# Patient Record
Sex: Male | Born: 1985 | Race: White | Hispanic: No | Marital: Married | Smoking: Former smoker
Health system: Southern US, Community
[De-identification: ages and names within clinical notes are randomized; demographics above are authoritative.]

## PROBLEM LIST (undated history)

## (undated) DIAGNOSIS — F419 Anxiety disorder, unspecified: Secondary | ICD-10-CM

## (undated) HISTORY — DX: Anxiety disorder, unspecified: F41.9

---
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
## 2006-01-26 ENCOUNTER — Ambulatory Visit: Payer: Self-pay | Admitting: Family Medicine

## 2006-02-05 ENCOUNTER — Ambulatory Visit: Payer: Self-pay | Admitting: Family Medicine

## 2006-12-16 ENCOUNTER — Emergency Department (HOSPITAL_COMMUNITY): Admission: EM | Admit: 2006-12-16 | Discharge: 2006-12-16 | Payer: Self-pay | Admitting: Emergency Medicine

## 2008-03-02 ENCOUNTER — Emergency Department (HOSPITAL_COMMUNITY): Admission: EM | Admit: 2008-03-02 | Discharge: 2008-03-02 | Payer: Self-pay | Admitting: Emergency Medicine

## 2009-02-06 ENCOUNTER — Emergency Department (HOSPITAL_COMMUNITY): Admission: EM | Admit: 2009-02-06 | Discharge: 2009-02-06 | Payer: Self-pay | Admitting: Emergency Medicine

## 2009-02-12 ENCOUNTER — Emergency Department (HOSPITAL_COMMUNITY): Admission: EM | Admit: 2009-02-12 | Discharge: 2009-02-12 | Payer: Self-pay | Admitting: Family Medicine

## 2009-05-09 ENCOUNTER — Emergency Department (HOSPITAL_COMMUNITY): Admission: EM | Admit: 2009-05-09 | Discharge: 2009-05-09 | Payer: Self-pay | Admitting: Family Medicine

## 2009-10-23 ENCOUNTER — Emergency Department (HOSPITAL_COMMUNITY): Admission: EM | Admit: 2009-10-23 | Discharge: 2009-10-23 | Payer: Self-pay | Admitting: Family Medicine

## 2010-05-15 ENCOUNTER — Emergency Department (HOSPITAL_COMMUNITY): Admission: EM | Admit: 2010-05-15 | Discharge: 2010-05-15 | Payer: Self-pay | Admitting: Family Medicine

## 2013-05-06 ENCOUNTER — Ambulatory Visit: Payer: Self-pay | Admitting: Family Medicine

## 2013-05-06 VITALS — BP 108/65 | HR 86 | Temp 98.9°F | Resp 18 | Wt 153.0 lb

## 2013-05-06 DIAGNOSIS — IMO0002 Reserved for concepts with insufficient information to code with codable children: Secondary | ICD-10-CM

## 2013-05-06 DIAGNOSIS — T07XXXA Unspecified multiple injuries, initial encounter: Secondary | ICD-10-CM
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
## 2013-05-06 NOTE — Patient Instructions (Addendum)
You have abrasions on your back and elbow, and a mild concussion.  When you have a concussion it is important to rest- this means mental and physical rest.  Sleep/ rest as much as you can, and avoid physical exercise.  Avoid reading, texting, using a computer or watching TV, listening to music is ok.  If you develop vomiting, severe headache, confusion or any other concerning symptoms please call, come back in to clinic or go to the ER.    Keep your abrasions clean, and pat dry after bathing.  We are glad to re-bandage your larger abrasion in a couple of days if you would like.    You need to rest until you are recovered fully from your concussion.  Going back to work too early will prolong your symptoms and could even lead to more permanent damage.  Please come back or call me if you are not feeling back to normal in 2 days or so

## 2013-05-09 ENCOUNTER — Ambulatory Visit: Payer: Self-pay

## 2015-05-14 ENCOUNTER — Encounter (HOSPITAL_COMMUNITY): Payer: Self-pay | Admitting: *Deleted

## 2015-05-14 ENCOUNTER — Emergency Department (HOSPITAL_COMMUNITY)
Admission: EM | Admit: 2015-05-14 | Discharge: 2015-05-14 | Disposition: A | Discharge status: Home / Self Care | Payer: Self-pay | Attending: Emergency Medicine | Admitting: Emergency Medicine

## 2015-05-14 DIAGNOSIS — X58XXXA Exposure to other specified factors, initial encounter: Secondary | ICD-10-CM | POA: Insufficient documentation

## 2015-05-14 DIAGNOSIS — Y9289 Other specified places as the place of occurrence of the external cause: Secondary | ICD-10-CM | POA: Insufficient documentation

## 2015-05-14 DIAGNOSIS — Z87891 Personal history of nicotine dependence: Secondary | ICD-10-CM | POA: Insufficient documentation

## 2015-05-14 DIAGNOSIS — Y9389 Activity, other specified: Secondary | ICD-10-CM | POA: Insufficient documentation

## 2015-05-14 DIAGNOSIS — Y998 Other external cause status: Secondary | ICD-10-CM | POA: Insufficient documentation

## 2015-05-14 DIAGNOSIS — T782XXA Anaphylactic shock, unspecified, initial encounter: Secondary | ICD-10-CM | POA: Insufficient documentation

## 2015-05-14 MED ORDER — METHYLPREDNISOLONE SODIUM SUCC 125 MG IJ SOLR
125.0000 mg | Freq: Once | INTRAMUSCULAR | Status: AC
  Administered 2015-05-14: 125 mg via INTRAVENOUS
  Filled 2015-05-14: qty 2

## 2015-05-14 MED ORDER — SODIUM CHLORIDE 0.9 % IV BOLUS (SEPSIS)
1000.0000 mL | Freq: Once | INTRAVENOUS | Status: AC
  Administered 2015-05-14: 1000 mL via INTRAVENOUS

## 2015-05-14 MED ORDER — PREDNISONE 20 MG PO TABS: 60.0000 mg | ORAL_TABLET | Freq: Every day | ORAL | Status: DC

## 2015-05-14 MED ORDER — FAMOTIDINE IN NACL 20-0.9 MG/50ML-% IV SOLN
20.0000 mg | Freq: Once | INTRAVENOUS | Status: AC
  Administered 2015-05-14: 20 mg via INTRAVENOUS
  Filled 2015-05-14: qty 50

## 2015-05-14 MED ORDER — DIPHENHYDRAMINE HCL 50 MG/ML IJ SOLN
25.0000 mg | Freq: Once | INTRAMUSCULAR | Status: AC
  Administered 2015-05-14: 25 mg via INTRAVENOUS
  Filled 2015-05-14: qty 1

## 2015-05-14 MED ORDER — EPINEPHRINE 0.3 MG/0.3ML IJ SOAJ: 0.3000 mg | Freq: Once | INTRAMUSCULAR | Status: DC

## 2015-05-14 MED ORDER — EPINEPHRINE 0.3 MG/0.3ML IJ SOAJ
INTRAMUSCULAR | Status: AC
  Filled 2015-05-14: qty 0.3

## 2015-05-14 MED ORDER — EPINEPHRINE 0.3 MG/0.3ML IJ SOAJ
0.3000 mg | Freq: Once | INTRAMUSCULAR | Status: AC
  Administered 2015-05-14: 0.3 mg via INTRAMUSCULAR

## 2015-05-14 NOTE — ED Notes (Signed)
Pt states he was stung by a bee around 12 pm.  States never had allergic rxn to a bee sting before.  Ate lunch and realized he was itching all over.  Face is red, with oral edema.

## 2015-05-14 NOTE — ED Notes (Signed)
No visible hives. No swelling noted to lips or mouth. NAD noted.

## 2015-05-14 NOTE — Discharge Instructions (Signed)

## 2015-05-14 NOTE — ED Provider Notes (Signed)
History   Chief Complaint  Patient presents with  . Allergic Reaction    HPI Patient is a previously healthy 29 year old male who presents to ED after allergic reaction. Patient reports at noon he was stung by a bee. Patient denies any prior history of allergic reactions side from mild rash with detergents. Denies allergies to bees in the past. Patient reports he had [employer] for lunch prior to this. Patient reports having diffuse rash to his upper chest and back as well as swelling of his lips and throat tightening. Patient is now complaining of shortness of breath. Denies previous illness and states he was in his usual state of health prior to the allergic reaction. Denies abdominal pain, nausea, vomiting.  Onset of symptoms: Acute. Duration 1 hour.  Modifying factors none.  Severity: Severe.  Associated symptoms: As above.  Hx of similar symptoms: No.    Past medical/surgical history, social history, medications, allergies and FH have been reviewed with patient and/or in documentation. Furthermore, if pt family or friend(s) present, additional historical information was obtained from them.  History reviewed. No pertinent past medical history. History reviewed. No pertinent past surgical history. Family History  Problem Relation Age of Onset  . Cancer Mother    History  Substance Use Topics  . Smoking status: Former Games developer  . Smokeless tobacco: Not on file  . Alcohol Use: Yes     Comment: occ     Review of Systems Constitutional: - F/C, -fatigue.  HENT: - congestion, -rhinorrhea, -sore throat.   Eyes: - eye pain, -visual disturbance.  Respiratory: - cough, +SOB, -hemoptysis.   Cardiovascular: - CP, -palps.  Gastrointestinal: - N/V/D, -abd pain  Genitourinary: - flank pain, -dysuria, -frequency.  Musculoskeletal: - myalgia/arthritis, -joint swelling, -gait abnormality, -back pain, -neck pain/stiffness, -leg pain/swelling.  Skin: + rash/lesion.  Neurological: - focal weakness,  -lightheadedness, -dizziness, -numbness, -HA.  All other systems reviewed and are negative.   Physical Exam  Physical Exam  ED Triage Vitals  Enc Vitals Group     BP 05/14/15 1256 142/116 mmHg     Pulse Rate 05/14/15 1256 73     Resp 05/14/15 1256 16     Temp 05/14/15 1256 98.1 F (36.7 C)     Temp Source 05/14/15 1256 Oral     SpO2 05/14/15 1256 96 %     Weight 05/14/15 1256 160 lb (72.576 kg)     Height 05/14/15 1256  (1.702 m)     Head Cir --      Peak Flow --      Pain Score 05/14/15 1257 2     Pain Loc --      Pain Edu? --      Excl. in GC? --    Constitutional: Patient is well appearing and in mild acute distress Head: Normocephalic and atraumatic.  Eyes: Extraocular motion intact, no scleral icterus Mouth: MMM, very small amount of OP edemat and erythemata Neck: Supple without meningismus, mass, or overt JVD Respiratory: No respiratory distress. Normal WOB. No w/r/g. CV: RRR, no obvious murmurs.  Pulses +2 and symmetric. Euvolemic Abdomen: Soft, NT, ND, no r/g. No mass.  MSK: Extremities are atraumatic without deformity, ROM intact Skin: Diffuse erythematous rash consistent with urticaria to the upper chest and back. Patient has swelling to his perioral region. Neuro: AAOx4, MAE 5/5 sym, no focal deficit noted   ED Course  Procedures    EKG Interpretation  Date/Time:    Ventricular Rate:  PR Interval:    QRS Duration:   QT Interval:    QTC Calculation:   R Axis:     Text Interpretation:         MDM: Gunnar Fusi Szydlowski is a 29 y.o. male with H&P as above who p/w CC: allergic rxn  -Initial impression: This is a piercing healthy 29 year old without history of allergic reactions in the past who arrives with perioral edema and erythema/edema to oropharynx. No wheezing. Hemodynamic stable. Epinephrine given. Benadryl, Zantac, Solumedrol given. Fluids. EKG.  1:33 PM Pt reports significant improvement in sxs. Rash nearly resolved. OP & lip swelling  improved.  2:13 PM Sxs resolved. HDS, NAD. Patient was observed in ED following epinephrine and patient's symptoms remained resolved and patient has been deemed stable for discharge. Patient was given a prescription for an EpiPen and sterilized. Old records reviewed (if available). Labs and imaging reviewed personally by myself and considered in medical decision making if ordered. Clinical Impression: 1. Anaphylaxis, initial encounter     Disposition: Discharge  Condition: Good  I have discussed the results, Dx and Tx plan with the pt(& family if present). He/she/they expressed understanding and agree(s) with the plan. Discharge instructions discussed at great length. Strict return precautions discussed and pt &/or family have verbalized understanding of the instructions. No further questions at time of discharge.    New Prescriptions   EPINEPHRINE 0.3 MG/0.3 ML IJ SOAJ INJECTION    Inject 0.3 mLs (0.3 mg total) into the muscle once.   PREDNISONE (DELTASONE) 20 MG TABLET    Take 3 tablets (60 mg total) by mouth daily.    Follow Up: [facility]     [address] [place] [address] [phone]     Pt seen in conjunction with Dr. Mirian Mo, MD  Ames Dura, DO [facility] Emergency Medicine Resident - PGY-3      Ames Dura, MD 05/14/15 1512  Mirian Mo, MD 05/15/15 [id number]

## 2016-05-03 ENCOUNTER — Ambulatory Visit: Payer: Self-pay | Admitting: Family Medicine

## 2017-05-23 ENCOUNTER — Ambulatory Visit (INDEPENDENT_AMBULATORY_CARE_PROVIDER_SITE_OTHER): Payer: No Typology Code available for payment source | Admitting: Adult Health

## 2017-05-23 ENCOUNTER — Encounter: Payer: Self-pay | Admitting: Adult Health

## 2017-05-23 VITALS — BP 108/65 | HR 81 | Ht 66.25 in | Wt 167.1 lb

## 2017-05-23 DIAGNOSIS — M25512 Pain in left shoulder: Secondary | ICD-10-CM

## 2017-05-23 DIAGNOSIS — M79642 Pain in left hand: Secondary | ICD-10-CM

## 2017-05-23 DIAGNOSIS — M25532 Pain in left wrist: Secondary | ICD-10-CM

## 2017-05-23 DIAGNOSIS — Z0000 Encounter for general adult medical examination without abnormal findings: Secondary | ICD-10-CM | POA: Diagnosis not present

## 2017-05-23 DIAGNOSIS — M25531 Pain in right wrist: Secondary | ICD-10-CM

## 2017-05-23 DIAGNOSIS — M25511 Pain in right shoulder: Secondary | ICD-10-CM

## 2017-05-23 DIAGNOSIS — M79641 Pain in right hand: Secondary | ICD-10-CM | POA: Insufficient documentation

## 2017-05-23 MED ORDER — MELOXICAM 7.5 MG PO TABS: 7.5000 mg | ORAL_TABLET | Freq: Two times a day (BID) | ORAL | 1 refills | Status: DC

## 2017-05-23 NOTE — Patient Instructions (Signed)
Joint Pain Joint pain, which is also called arthralgia, can be caused by many things. Joint pain often goes away when you follow your health care provider's instructions for relieving pain at home. However, joint pain can also be caused by conditions that require further treatment. Common causes of joint pain include:  Bruising in the area of the joint.  Overuse of the joint.  Wear and tear on the joints that occur with aging (osteoarthritis).  Various other forms of arthritis.  A buildup of a crystal form of uric acid in the joint (gout).  Infections of the joint (septic arthritis) or of the bone (osteomyelitis).  Your health care provider may recommend medicine to help with the pain. If your joint pain continues, additional tests may be needed to diagnose your condition. Follow these instructions at home: Watch your condition for any changes. Follow these instructions as directed to lessen the pain that you are feeling.  Take medicines only as directed by your health care provider.  Rest the affected area for as long as your health care provider says that you should. If directed to do so, raise the painful joint above the level of your heart while you are sitting or lying down.  Do not do things that cause or worsen pain.  If directed, apply ice to the painful area: ? Put ice in a plastic bag. ? Place a towel between your skin and the bag. ? Leave the ice on for 20 minutes, 2-3 times per day.  Wear an elastic bandage, splint, or sling as directed by your health care provider. Loosen the elastic bandage or splint if your fingers or toes become numb and tingle, or if they turn cold and blue.  Begin exercising or stretching the affected area as directed by your health care provider. Ask your health care provider what types of exercise are safe for you.  Keep all follow-up visits as directed by your health care provider. This is important.  Contact a health care provider if:  Your  pain increases, and medicine does not help.  Your joint pain does not improve within 3 days.  You have increased bruising or swelling.  You have a fever.  You lose 10 lb (4.5 kg) or more without trying. Get help right away if:  You are not able to move the joint.  Your fingers or toes become numb or they turn cold and blue. This information is not intended to replace advice given to you by your health care provider. Make sure you discuss any questions you have with your health care provider. Document Released: 09/25/2005 Document Revised: 02/25/2016 Document Reviewed: 07/07/2014 Elsevier Interactive Patient Education  2018 [employer].   Shoulder Exercises Ask your health care provider which exercises are safe for you. Do exercises exactly as told by your health care provider and adjust them as directed. It is normal to feel mild stretching, pulling, tightness, or discomfort as you do these exercises, but you should stop right away if you feel sudden pain or your pain gets worse.Do not begin these exercises until told by your health care provider. RANGE OF MOTION EXERCISES These exercises warm up your muscles and joints and improve the movement and flexibility of your shoulder. These exercises also help to relieve pain, numbness, and tingling. These exercises involve stretching your injured shoulder directly. Exercise A: Pendulum  1. Stand near a wall or a surface that you can hold onto for balance. 2. Bend at the waist and let your  left / right arm hang straight down. Use your other arm to support you. Keep your back straight and do not lock your knees. 3. Relax your left / right arm and shoulder muscles, and move your hips and your trunk so your left / right arm swings freely. Your arm should swing because of the motion of your body, not because you are using your arm or shoulder muscles. 4. Keep moving your body so your arm swings in the following directions, as told by your health  care provider: ? Side to side. ? Forward and backward. ? In clockwise and counterclockwise circles. 5. Continue each motion for __________ seconds, or for as long as told by your health care provider. 6. Slowly return to the starting position. Repeat __________ times. Complete this exercise __________ times a day. Exercise B:Flexion, Standing  1. Stand and hold a broomstick, a cane, or a similar object. Place your hands a little more than shoulder-width apart on the object. Your left / right hand should be palm-up, and your other hand should be palm-down. 2. Keep your elbow straight and keep your shoulder muscles relaxed. Push the stick down with your healthy arm to raise your left / right arm in front of your body, and then over your head until you feel a stretch in your shoulder. ? Avoid shrugging your shoulder while you raise your arm. Keep your shoulder blade tucked down toward the middle of your back. 3. Hold for __________ seconds. 4. Slowly return to the starting position. Repeat __________ times. Complete this exercise __________ times a day. Exercise C: Abduction, Standing 1. Stand and hold a broomstick, a cane, or a similar object. Place your hands a little more than shoulder-width apart on the object. Your left / right hand should be palm-up, and your other hand should be palm-down. 2. While keeping your elbow straight and your shoulder muscles relaxed, push the stick across your body toward your left / right side. Raise your left / right arm to the side of your body and then over your head until you feel a stretch in your shoulder. ? Do not raise your arm above shoulder height, unless your health care provider tells you to do that. ? Avoid shrugging your shoulder while you raise your arm. Keep your shoulder blade tucked down toward the middle of your back. 3. Hold for __________ seconds. 4. Slowly return to the starting position. Repeat __________ times. Complete this exercise  __________ times a day. Exercise D:Internal Rotation  1. Place your left / right hand behind your back, palm-up. 2. Use your other hand to dangle an exercise band, a towel, or a similar object over your shoulder. Grasp the band with your left / right hand so you are holding onto both ends. 3. Gently pull up on the band until you feel a stretch in the front of your left / right shoulder. ? Avoid shrugging your shoulder while you raise your arm. Keep your shoulder blade tucked down toward the middle of your back. 4. Hold for __________ seconds. 5. Release the stretch by letting go of the band and lowering your hands. Repeat __________ times. Complete this exercise __________ times a day. STRETCHING EXERCISES These exercises warm up your muscles and joints and improve the movement and flexibility of your shoulder. These exercises also help to relieve pain, numbness, and tingling. These exercises are done using your healthy shoulder to help stretch the muscles of your injured shoulder. Exercise E: Runner, broadcasting/film/videoCorner Stretch (External Rotation  and Abduction)  1. Stand in a doorway with one of your feet slightly in front of the other. This is called a staggered stance. If you cannot reach your forearms to the door frame, stand facing a corner of a room. 2. Choose one of the following positions as told by your health care provider: ? Place your hands and forearms on the door frame above your head. ? Place your hands and forearms on the door frame at the height of your head. ? Place your hands on the door frame at the height of your elbows. 3. Slowly move your weight onto your front foot until you feel a stretch across your chest and in the front of your shoulders. Keep your head and chest upright and keep your abdominal muscles tight. 4. Hold for __________ seconds. 5. To release the stretch, shift your weight to your back foot. Repeat __________ times. Complete this stretch __________ times a day. Exercise  F:Extension, Standing 1. Stand and hold a broomstick, a cane, or a similar object behind your back. ? Your hands should be a little wider than shoulder-width apart. ? Your palms should face away from your back. 2. Keeping your elbows straight and keeping your shoulder muscles relaxed, move the stick away from your body until you feel a stretch in your shoulder. ? Avoid shrugging your shoulders while you move the stick. Keep your shoulder blade tucked down toward the middle of your back. 3. Hold for __________ seconds. 4. Slowly return to the starting position. Repeat __________ times. Complete this exercise __________ times a day. STRENGTHENING EXERCISES These exercises build strength and endurance in your shoulder. Endurance is the ability to use your muscles for a long time, even after they get tired. Exercise G:External Rotation  1. Sit in a stable chair without armrests. 2. Secure an exercise band at elbow height on your left / right side. 3. Place a soft object, such as a folded towel or a small pillow, between your left / right upper arm and your body to move your elbow a few inches away (about 10 cm) from your side. 4. Hold the end of the band so it is tight and there is no slack. 5. Keeping your elbow pressed against the soft object, move your left / right forearm out, away from your abdomen. Keep your body steady so only your forearm moves. 6. Hold for __________ seconds. 7. Slowly return to the starting position. Repeat __________ times. Complete this exercise __________ times a day. Exercise H:Shoulder Abduction  1. Sit in a stable chair without armrests, or stand. 2. Hold a __________ weight in your left / right hand, or hold an exercise band with both hands. 3. Start with your arms straight down and your left / right palm facing in, toward your body. 4. Slowly lift your left / right hand out to your side. Do not lift your hand above shoulder height unless your health care  provider tells you that this is safe. ? Keep your arms straight. ? Avoid shrugging your shoulder while you do this movement. Keep your shoulder blade tucked down toward the middle of your back. 5. Hold for __________ seconds. 6. Slowly lower your arm, and return to the starting position. Repeat __________ times. Complete this exercise __________ times a day. Exercise I:Shoulder Extension 1. Sit in a stable chair without armrests, or stand. 2. Secure an exercise band to a stable object in front of you where it is at shoulder height. 3. Hold  one end of the exercise band in each hand. Your palms should face each other. 4. Straighten your elbows and lift your hands up to shoulder height. 5. Step back, away from the secured end of the exercise band, until the band is tight and there is no slack. 6. Squeeze your shoulder blades together as you pull your hands down to the sides of your thighs. Stop when your hands are straight down by your sides. Do not let your hands go behind your body. 7. Hold for __________ seconds. 8. Slowly return to the starting position. Repeat __________ times. Complete this exercise __________ times a day. Exercise J:Standing Shoulder Row 1. Sit in a stable chair without armrests, or stand. 2. Secure an exercise band to a stable object in front of you so it is at waist height. 3. Hold one end of the exercise band in each hand. Your palms should be in a thumbs-up position. 4. Bend each of your elbows to an "L" shape (about 90 degrees) and keep your upper arms at your sides. 5. Step back until the band is tight and there is no slack. 6. Slowly pull your elbows back behind you. 7. Hold for __________ seconds. 8. Slowly return to the starting position. Repeat __________ times. Complete this exercise __________ times a day. Exercise K:Shoulder Press-Ups  1. Sit in a stable chair that has armrests. Sit upright, with your feet flat on the floor. 2. Put your hands on the  armrests so your elbows are bent and your fingers are pointing forward. Your hands should be about even with the sides of your body. 3. Push down on the armrests and use your arms to lift yourself off of the chair. Straighten your elbows and lift yourself up as much as you comfortably can. ? Move your shoulder blades down, and avoid letting your shoulders move up toward your ears. ? Keep your feet on the ground. As you get stronger, your feet should support less of your body weight as you lift yourself up. 4. Hold for __________ seconds. 5. Slowly lower yourself back into the chair. Repeat __________ times. Complete this exercise __________ times a day. Exercise L: Wall Push-Ups  1. Stand so you are facing a stable wall. Your feet should be about one arm-length away from the wall. 2. Lean forward and place your palms on the wall at shoulder height. 3. Keep your feet flat on the floor as you bend your elbows and lean forward toward the wall. 4. Hold for __________ seconds. 5. Straighten your elbows to push yourself back to the starting position. Repeat __________ times. Complete this exercise __________ times a day. This information is not intended to replace advice given to you by your health care provider. Make sure you discuss any questions you have with your health care provider. Document Released: 08/09/2005 Document Revised: 06/19/2016 Document Reviewed: 06/06/2015 Elsevier Interactive Patient Education  2018 [employer]   Wrist and Forearm Exercises Ask your health care provider which exercises are safe for you. Do exercises exactly as told by your health care provider and adjust them as directed. It is normal to feel mild stretching, pulling, tightness, or discomfort as you do these exercises, but you should stop right away if you feel sudden pain or your pain gets worse. Do not begin these exercises until told by your health care provider. RANGE OF MOTION EXERCISES These exercises  warm up your muscles and joints and improve the movement and flexibility of your injured wrist  and forearm. These exercises also help to relieve pain, numbness, and tingling. These exercises are done using the muscles in your injured wrist and forearm. Exercise A: Wrist Flexion, Active 1. With your fingers relaxed, bend your wrist forward as far as you can. 2. Hold this position for __________ seconds. Repeat __________ times. Complete this exercise __________ times a day. Exercise B: Wrist Extension, Active 1. With your fingers relaxed, bend your wrist backward as far as you can. 2. Hold this position for __________ seconds. Repeat __________ times. Complete this exercise __________ times a day. Exercise C: Supination, Active  1. Stand or sit with your arms at your sides. 2. Bend your left / right elbow to an "L" shape (90 degrees). 3. Turn your palm upward until you feel a gentle stretch on the inside of your forearm. 4. Hold this position for __________ seconds. 5. Slowly return your palm to the starting position. Repeat __________ times. Complete this exercise __________ times a day. Exercise D: Pronation, Active  1. Stand or sit with your arms at your sides. 2. Bend your left / right elbow to an "L" shape (90 degrees). 3. Turn your palm downward until you feel a gentle stretch on the top of your forearm. 4. Hold this position for __________ seconds. 5. Slowly return your palm to the starting position. Repeat __________ times. Complete this exercise __________ times a day. STRETCHING EXERCISES These exercises warm up your muscles and joints and improve the movement and flexibility of your injured wrist and forearm. These exercises also help to relieve pain, numbness, and tingling. These exercises are done using your healthy wrist and forearm to help stretch the muscles in your injured wrist and forearm. Exercise E: Wrist Flexion, Passive  1. Extend your left / right arm in front of  you, relax your wrist, and point your fingers downward. 2. Gently push on the back of your hand. Stop when you feel a gentle stretch on the top of your forearm. 3. Hold this position for __________ seconds. Repeat __________ times. Complete this exercise __________ times a day. Exercise F: Wrist Extension, Passive  1. Extend your left / right arm in front of you and turn your palm upward. 2. Gently pull your palm and fingertips back so your fingers point downward. You should feel a gentle stretch on the palm-side of your forearm. 3. Hold this position for __________ seconds. Repeat __________ times. Complete this exercise __________ times a day. Exercise G: Forearm Rotation, Supination, Passive 8. Sit with your left / right elbow bent to an "L" shape (90 degrees) with your forearm resting on a table. 9. Keeping your upper body and shoulder still, use your other hand to rotate your forearm palm-up until you feel a gentle to moderate stretch. 10. Hold this position for __________ seconds. 11. Slowly release the stretch and return to the starting position. Repeat __________ times. Complete this exercise __________ times a day. Exercise H: Forearm Rotation, Pronation, Passive 1. Sit with your left / right elbow bent to an "L" shape (90 degrees) with your forearm resting on a table. 2. Keeping your upper body and shoulder still, use your other hand to rotate your forearm palm-down until you feel a gentle to moderate stretch. 3. Hold this position for __________ seconds. 4. Slowly release the stretch and return to the starting position. Repeat __________ times. Complete this exercise __________ times a day. STRENGTHENING EXERCISES These exercises build strength and endurance in your wrist and forearm. Endurance is the ability  to use your muscles for a long time, even after they get tired. Exercise I: Wrist Flexors  9. Sit with your left / right forearm supported on a table and your hand resting  palm-up over the edge of the table. Your elbow should be bent to an "L" shape (about 90 degrees) and be below the level of your shoulder. 10. Hold a __________ weight in your left / right hand. Or, hold a rubber exercise band or tube in both hands, keeping your hands at the same level and hip distance apart. There should be a slight tension in the exercise band or tube. 11. Slowly curl your hand up toward your forearm. 12. Hold this position for __________ seconds. 13. Slowly lower your hand back to the starting position. Repeat __________ times. Complete this exercise __________ times a day. Exercise J: Wrist Extensors  9. Sit with your left / right forearm supported on a table and your hand resting palm-down over the edge of the table. Your elbow should be bent to an "L" shape (about 90 degrees) and be below the level of your shoulder. 10. Hold a __________ weight in your left / right hand. Or, hold a rubber exercise band or tube in both hands, keeping your hands at the same level and hip distance apart. There should be a slight tension in the exercise band or tube. 11. Slowly curl your hand up toward your forearm. 12. Hold this position for __________ seconds. 13. Slowly lower your hand back to the starting position. Repeat __________ times. Complete this exercise __________ times a day. Exercise K: Forearm Rotation, Supination  1. Sit with your left / right forearm supported on a table and your hand resting palm-down. Your elbow should be at your side, bent to an "L" shape (about 90 degrees), and below the level of your shoulder. Keep your wrist stable and in a neutral position throughout the exercise. 2. Gently hold a lightweight hammer with your left / right hand. 3. Without moving your elbow or wrist, slowly rotate your palm upward to a thumbs-up position. 4. Hold this position for __________ seconds. 5. Slowly return your forearm to the starting position. Repeat __________ times.  Complete this exercise __________ times a day. Exercise L: Forearm Rotation, Pronation  6. Sit with your left / right forearm supported on a table and your hand resting palm-up. Your elbow should be at your side, bent to an "L" shape (about 90 degrees), and below the level of your shoulder. Keep your wrist stable. Do not allow it to move backward or forward during the exercise. 7. Gently hold a lightweight hammer with your left / right hand. 8. Without moving your elbow or wrist, slowly rotate your palm and hand upward to a thumbs-up position. 9. Hold this position for __________ seconds. 10. Slowly return your forearm to the starting position. Repeat __________ times. Complete this exercise __________ times a day. Exercise M: Grip Strengthening  1. Hold one of these items in your left / right hand: play dough, therapy putty, a dense sponge, a stress ball, or a large, rolled sock. 2. Squeeze as hard as you can without increasing pain. 3. Hold this position for __________ seconds. 4. Slowly release your grip. Repeat __________ times. Complete this exercise __________ times a day. This information is not intended to replace advice given to you by your health care provider. Make sure you discuss any questions you have with your health care provider. Document Released: 08/09/2005 Document Revised: 06/19/2016  Document Reviewed: 06/20/2015 Elsevier Interactive Patient Education  2018 [employer].  Continue excellent hydration-water and gatorade. Continue to avoid tobacco use-GREAT JOB! Reduce saturated fat-fast food. Use Meloxicam as needed for joint pain. Recommend daily stretching-YouTube/Pinterest Videos. Please schedule nurse visit for fasting labs at your convenience. Annual physical, sooner if needed. WELCOME TO THE PRACTICE!

## 2017-05-23 NOTE — Assessment & Plan Note (Addendum)
BP at goal- 108/65, HR 81 Continue excellent hydration-water and gatorade. Continue to avoid tobacco use-GREAT JOB! Reduce saturated fat-fast food. Use Meloxicam as needed for joint pain. Recommend daily stretching-YouTube/Pinterest Videos. Please schedule nurse visit for fasting labs at your convenience. Continue to abstain from tobacco/EOTH

## 2017-05-23 NOTE — Assessment & Plan Note (Signed)
Recommend daily stretching-YouTube/Pinterest Videos. Meloxicam 7.5mg BID PRN. If pain worsens/persists, then will refer to PT. 

## 2017-05-23 NOTE — Progress Notes (Signed)
Subjective:    Patient ID: Richard Chang, male    DOB: 1986-06-15, 31 y.o.   MRN: [id number]  HPI:  Mr Richard Chang presents to establish as a new pt.  He is a very pleasant 31 year old make.  PMH: Generalized joint pain-bil shoulders/wrists/hands.  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  He feels that pain started >1.5 weeks ago and he has taken OTC Goody's Arthritis with minimal sx relief.  He is right hand dominant and denies frequent computer work/typing. He quit smoking tobacco 2015 and stopped chewing tobacco last week.  He eats a diet high in saturated fat-fast food almost daily and denies formal exercise, however moves/lifts/walks all day when working. He is married and has 4 children (daughter age 30, son 287 (severe autism), son 2, son 4 months).  He reports sleeping well and overall is quite happy with life.   Patient Care Team    Relationship Specialty Notifications Start End  Thomasene Lotpalski, Deborah, DO PCP - General Family Medicine  05/02/16     Patient Active Problem List   Diagnosis Date Noted  . Bilateral shoulder pain 05/23/2017  . Bilateral wrist pain 05/23/2017  . Bilateral hand pain 05/23/2017  . Healthcare maintenance 05/23/2017     History reviewed. No pertinent past medical history.   History reviewed. No pertinent surgical history.   Family History  Problem Relation Age of Onset  . Cancer Mother   . Cancer Father        skin  . Healthy Sister   . Healthy Brother   . Healthy Son   . Healthy Brother   . Healthy Son      History  Drug Use No     History  Alcohol Use  . Yes    Comment: occ     History  Smoking Status  . Former Smoker  . Packs/day: 1.00  . Years: 6.00  . Types: Cigarettes  . Quit date: 10/09/2013  Smokeless Tobacco  . Former NeurosurgeonUser  . Types: Chew  . Quit date: 05/09/2017     Outpatient Encounter Prescriptions as of 05/23/2017  Medication  Sig  . EPINEPHrine 0.3 mg/0.3 mL IJ SOAJ injection Inject 0.3 mLs (0.3 mg total) into the muscle once.  . meloxicam (MOBIC) 7.5 MG tablet Take 1 tablet (7.5 mg total) by mouth 2 (two) times daily.  . [DISCONTINUED] predniSONE (DELTASONE) 20 MG tablet Take 3 tablets (60 mg total) by mouth daily.   No facility-administered encounter medications on file as of 05/23/2017.     Allergies: Bee venom; Codeine; and Penicillins  Body mass index is 26.77 kg/m.  Blood pressure 108/65, pulse 81, height 5' 6.25" (1.683 m), weight 167 lb 1.6 oz (75.8 kg).     Review of Systems  Constitutional: Positive for fatigue. Negative for activity change, appetite change, chills, diaphoresis, fever and unexpected weight change.  HENT: Negative for congestion.   Eyes: Negative for visual disturbance.  Respiratory: Negative for cough, chest tightness, shortness of breath and stridor.   Cardiovascular: Negative for chest pain, palpitations and leg swelling.  Gastrointestinal: Negative for abdominal distention, abdominal pain, blood in stool, constipation, diarrhea, nausea, rectal pain and vomiting.  Endocrine: Negative for cold intolerance, heat intolerance, polydipsia, polyphagia and polyuria.  Genitourinary: Negative for difficulty urinating, flank pain and hematuria.  Musculoskeletal: Positive for arthralgias, back pain and myalgias. Negative for gait problem, joint swelling, neck pain and  neck stiffness.  Skin: Negative for color change, pallor, rash and wound.  Allergic/Immunologic: Negative for immunocompromised state.  Neurological: Negative for dizziness, tremors, weakness and light-headedness.  Hematological: Does not bruise/bleed easily.  Psychiatric/Behavioral: Negative for decreased concentration, dysphoric mood, hallucinations, self-injury, sleep disturbance and suicidal ideas. The patient is not nervous/anxious and is not hyperactive.        Objective:   Physical Exam  Constitutional: He is  oriented to person, place, and time. He appears well-developed and well-nourished. No distress.  HENT:  Head: Normocephalic and atraumatic.  Right Ear: Hearing, external ear and ear canal normal. Tympanic membrane is not erythematous and not bulging. No decreased hearing is noted.  Left Ear: Hearing, tympanic membrane, external ear and ear canal normal. Tympanic membrane is not erythematous and not bulging. No decreased hearing is noted.  Nose: Nose normal. Right sinus exhibits no maxillary sinus tenderness and no frontal sinus tenderness. Left sinus exhibits no maxillary sinus tenderness and no frontal sinus tenderness.  Mouth/Throat: Uvula is midline, oropharynx is clear and moist and mucous membranes are normal.  Eyes: Pupils are equal, round, and reactive to light. Conjunctivae are normal.  Neck: Normal range of motion. Neck supple.  Cardiovascular: Normal rate, normal heart sounds and intact distal pulses.   No murmur heard. Pulmonary/Chest: Effort normal and breath sounds normal. No respiratory distress. He has no wheezes. He has no rales. He exhibits no tenderness.  Musculoskeletal: Normal range of motion. He exhibits tenderness. He exhibits no edema or deformity.       Right shoulder: He exhibits tenderness. He exhibits normal range of motion, no bony tenderness, no swelling, no deformity, no pain, no spasm, normal pulse and normal strength.       Left shoulder: He exhibits tenderness. He exhibits normal range of motion, no bony tenderness, no deformity, no pain, no spasm, normal pulse and normal strength.       Right wrist: He exhibits tenderness. He exhibits normal range of motion, no bony tenderness and no swelling.       Left wrist: He exhibits tenderness. He exhibits normal range of motion and no bony tenderness.       Lumbar back: He exhibits tenderness. He exhibits normal range of motion and no bony tenderness.       Right hand: He exhibits normal range of motion, normal capillary  refill and no swelling. Normal sensation noted. Normal strength noted.       Left hand: He exhibits normal range of motion, normal capillary refill and no swelling. Normal sensation noted. Normal strength noted.  Muscle soreness when gripping, however equal/strong grip strengths.  Lymphadenopathy:    He has no cervical adenopathy.  Neurological: He is alert and oriented to person, place, and time. Coordination normal.  Skin: Skin is warm and dry. No rash noted. He is not diaphoretic. No erythema. No pallor.  Psychiatric: He has a normal mood and affect. His behavior is normal. Judgment and thought content normal.  Nursing note and vitals reviewed.         Assessment & Plan:   1. Healthcare maintenance   2. Acute pain of both shoulders   3. Bilateral wrist pain   4. Bilateral hand pain     Bilateral shoulder pain Recommend daily stretching-YouTube/Pinterest Videos. Meloxicam 7.5mg  BID PRN. If pain worsens/persists, then will refer to PT.  Bilateral wrist pain Recommend daily stretching-YouTube/Pinterest Videos. Meloxicam 7.5mg  BID PRN. If pain worsens/persists, then will refer to PT.  Bilateral hand pain Recommend  daily stretching-YouTube/Pinterest Videos. Meloxicam 7.5mg  BID PRN. If pain worsens/persists, then will refer to PT.  Healthcare maintenance Continue excellent hydration-water and gatorade. Continue to avoid tobacco use-GREAT JOB! Reduce saturated fat-fast food. Use Meloxicam as needed for joint pain. Recommend daily stretching-YouTube/Pinterest Videos. Please schedule nurse visit for fasting labs at your convenience.    FOLLOW-UP:  Return in about 1 year (around 05/23/2018) for CPE.

## 2017-05-23 NOTE — Assessment & Plan Note (Signed)
Recommend daily stretching-YouTube/Pinterest Videos. Meloxicam 7.5mg  BID PRN. If pain worsens/persists, then will refer to PT.

## 2017-05-28 ENCOUNTER — Other Ambulatory Visit: Payer: No Typology Code available for payment source

## 2017-05-28 DIAGNOSIS — Z0000 Encounter for general adult medical examination without abnormal findings: Secondary | ICD-10-CM

## 2017-05-29 LAB — CBC WITH DIFFERENTIAL/PLATELET
BASOS: 0 %
Basophils Absolute: 0 10*3/uL (ref 0.0–0.2)
EOS (ABSOLUTE): 0.1 10*3/uL (ref 0.0–0.4)
Eos: 1 %
HEMATOCRIT: 40.3 % (ref 37.5–51.0)
HEMOGLOBIN: 13.6 g/dL (ref 13.0–17.7)
Immature Grans (Abs): 0 10*3/uL (ref 0.0–0.1)
Immature Granulocytes: 0 %
LYMPHS ABS: 1.8 10*3/uL (ref 0.7–3.1)
LYMPHS: 27 %
MCH: 29.1 pg (ref 26.6–33.0)
MCHC: 33.7 g/dL (ref 31.5–35.7)
MCV: 86 fL (ref 79–97)
MONOCYTES: 11 %
MONOS ABS: 0.7 10*3/uL (ref 0.1–0.9)
NEUTROS ABS: 4.1 10*3/uL (ref 1.4–7.0)
Neutrophils: 61 %
Platelets: 230 10*3/uL (ref 150–379)
RBC: 4.68 x10E6/uL (ref 4.14–5.80)
RDW: 13.4 % (ref 12.3–15.4)
WBC: 6.6 10*3/uL (ref 3.4–10.8)

## 2017-05-29 LAB — HEMOGLOBIN A1C
ESTIMATED AVERAGE GLUCOSE: 97 mg/dL
HEMOGLOBIN A1C: 5 % (ref 4.8–5.6)

## 2017-05-29 LAB — COMPREHENSIVE METABOLIC PANEL
A/G RATIO: 1.7 (ref 1.2–2.2)
ALBUMIN: 4.2 g/dL (ref 3.5–5.5)
ALK PHOS: 71 IU/L (ref 39–117)
ALT: 17 IU/L (ref 0–44)
AST: 19 IU/L (ref 0–40)
BUN / CREAT RATIO: 10 (ref 9–20)
BUN: 10 mg/dL (ref 6–20)
Bilirubin Total: 0.5 mg/dL (ref 0.0–1.2)
CO2: 27 mmol/L (ref 20–29)
CREATININE: 0.99 mg/dL (ref 0.76–1.27)
Calcium: 9.1 mg/dL (ref 8.7–10.2)
Chloride: 103 mmol/L (ref 96–106)
GFR calc Af Amer: 118 mL/min/{1.73_m2} (ref >59)
GFR, EST NON AFRICAN AMERICAN: 102 mL/min/{1.73_m2} (ref >59)
GLOBULIN, TOTAL: 2.5 g/dL (ref 1.5–4.5)
Glucose: 89 mg/dL (ref 65–99)
POTASSIUM: 4.3 mmol/L (ref 3.5–5.2)
SODIUM: 142 mmol/L (ref 134–144)
Total Protein: 6.7 g/dL (ref 6.0–8.5)

## 2017-05-29 LAB — LIPID PANEL
CHOL/HDL RATIO: 3.5 ratio (ref 0.0–5.0)
Cholesterol, Total: 127 mg/dL (ref 100–199)
HDL: 36 mg/dL — AB (ref >39)
LDL Calculated: 72 mg/dL (ref 0–99)
Triglycerides: 96 mg/dL (ref 0–149)
VLDL CHOLESTEROL CAL: 19 mg/dL (ref 5–40)

## 2017-05-29 LAB — TSH: TSH: 2.49 u[IU]/mL (ref 0.450–4.500)

## 2017-05-29 LAB — VITAMIN D 25 HYDROXY (VIT D DEFICIENCY, FRACTURES): VIT D 25 HYDROXY: 29.2 ng/mL — AB (ref 30.0–100.0)

## 2017-06-18 ENCOUNTER — Ambulatory Visit: Payer: No Typology Code available for payment source | Admitting: Adult Health

## 2018-02-04 ENCOUNTER — Telehealth: Payer: Self-pay | Admitting: Family Medicine

## 2018-02-04 NOTE — Telephone Encounter (Signed)
Patient's wife called to ask for Appt.--- Wife states he now is request allergy shots--forwarding message to provider/ medical assistant to review if provider to even perform, or contact pt if decision is to refer out to Allergy Specialist.  Pt has made OV appointment for 02/14/18.  --Richard Chang

## 2018-02-04 NOTE — Telephone Encounter (Signed)
Please authorize referral to allergist.  Per Dr. Sharee Holster, we will not administer allergy injections in our office.  Tiajuana Amass, CMA

## 2018-02-05 ENCOUNTER — Other Ambulatory Visit: Payer: Self-pay | Admitting: Adult Health

## 2018-02-05 DIAGNOSIS — Z9189 Other specified personal risk factors, not elsewhere classified: Secondary | ICD-10-CM

## 2018-02-05 DIAGNOSIS — Z889 Allergy status to unspecified drugs, medicaments and biological substances status: Secondary | ICD-10-CM

## 2018-02-05 NOTE — Progress Notes (Signed)
Referral to Allergist placed

## 2018-02-05 NOTE — Telephone Encounter (Signed)
Completed. Thanks! Katy  

## 2018-02-14 ENCOUNTER — Ambulatory Visit (INDEPENDENT_AMBULATORY_CARE_PROVIDER_SITE_OTHER): Payer: No Typology Code available for payment source | Admitting: Family Medicine

## 2018-02-14 ENCOUNTER — Encounter: Payer: Self-pay | Admitting: Family Medicine

## 2018-02-14 VITALS — BP 109/73 | HR 69 | Ht 66.25 in | Wt 171.5 lb

## 2018-02-14 DIAGNOSIS — H1013 Acute atopic conjunctivitis, bilateral: Secondary | ICD-10-CM

## 2018-02-14 DIAGNOSIS — J3089 Other allergic rhinitis: Secondary | ICD-10-CM | POA: Diagnosis not present

## 2018-02-14 DIAGNOSIS — J309 Allergic rhinitis, unspecified: Secondary | ICD-10-CM | POA: Diagnosis not present

## 2018-02-14 MED ORDER — OLOPATADINE HCL 0.1 % OP SOLN: 1.0000 [drp] | Freq: Two times a day (BID) | OPHTHALMIC | 1 refills | Status: DC

## 2018-02-14 MED ORDER — FEXOFENADINE HCL 180 MG PO TABS: 180.0000 mg | ORAL_TABLET | Freq: Every day | ORAL | 3 refills | Status: DC

## 2018-02-14 MED ORDER — FLUTICASONE PROPIONATE 50 MCG/ACT NA SUSP: NASAL | 11 refills | Status: DC

## 2018-02-14 NOTE — Patient Instructions (Signed)
You can use over-the-counter afrin nasal spray for up to 3 days (NO longer than that) which will help acutely with nasal drainage/ congestion short term.     Also, sterile saline nasal rinses, such as Neil med or AYR sinus rinses, can be very helpful and should be done twice daily- especially throughout the allergy season.   Remember you should use distilled water or previously boiled water to do this.  Then you may use over-the-counter Flonase 1 spray each nostril twice daily after sinus rinses.  You can do this in addition to taking any Allegra or Claritin or Zyrtec etc. that you may be taking daily.  If your eyes tend to get an itchy or irritated feeling when your seasonal allergies get bad, you can use Naphcon-A over-the-counter eyedrops as needed  

## 2018-02-14 NOTE — Progress Notes (Signed)
Acute Care Office visit  Assessment and plan:  1. Environmental and seasonal allergies   2. Allergic rhinitis, unspecified seasonality, unspecified trigger   3. Allergic conjunctivitis and rhinitis, bilateral    Environmental and seasonal allergies: - Avoid allergens as much as possible  - Shower and frequent face washing as well as using Neti-Pot - Will prescribe eye drop - Continue Flonase and Allegra     Meds ordered this encounter  Medications  . olopatadine (PATANOL) 0.1 % ophthalmic solution    Sig: Place 1 drop into both eyes 2 (two) times daily.    Dispense:  5 mL    Refill:  1  . fluticasone (FLONASE) 50 MCG/ACT nasal spray    Sig: 1 spray each nostril following sinus rinse twice daily    Dispense:  1 g    Refill:  11  . fexofenadine (ALLEGRA) 180 MG tablet    Sig: Take 1 tablet (180 mg total) by mouth daily.    Dispense:  90 tablet    Refill:  3    Gross side effects, risk and benefits, and alternatives of medications discussed with patient.  Patient is aware that all medications have potential side effects and we are unable to predict every sideeffect or drug-drug interaction that may occur.  Expresses verbal understanding and consents to current therapy plan and treatment regiment.   Education and routine counseling performed. Handouts provided.  Anticipatory guidance and routine counseling done re: condition, txmnt options and need for follow up. All questions of patient's were answered.  Return if symptoms worsen or fail to improve, for Follow-up with your PCP in the near future.  Please see AVS handed out to patient at the end of our visit for additional patient instructions/ counseling done pertaining to today's office visit.  Note: This document was partially repared using Dragon voice recognition software and may include unintentional dictation errors.  This document serves as a record of services personally performed by Thomasene Lot, DO. It  was created on her behalf by Earmon Phoenix, a trained medical scribe. The creation of this record is based on the scribe's personal observations and the provider's statements to them.   I have reviewed the above medical documentation for accuracy and completeness and I concur.  Thomasene Lot 02/18/18 5:30 PM    Subjective:    Chief Complaint  Patient presents with  . 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    Patient Care Team    Relationship Specialty Notifications Start End  Porcupine, Orpha Bur D, NP PCP - General Family Medicine  02/04/18     Past medical history, Surgical history, Family history reviewed and noted below, Social history, Allergies, and Medications have been entered into the medical record, reviewed and changed as needed.   Allergies  Allergen Reactions  . Bee Venom Anaphylaxis  . Codeine   . Penicillins     Review of Systems: - see above HPI for pertinent positives General:   No F/C, wt loss Pulm:   No DIB, pleuritic chest pain Card:  No  CP, palpitations Abd:  No n/v/d or pain Ext:  No inc edema from baseline   Objective:   Blood pressure 109/73, pulse 69, height 5' 6.25" (1.683 m), weight 171 lb 8 oz (77.8 kg), SpO2 99 %. Body mass index is 27.47 kg/m. General: Well Developed, well nourished, appropriate for stated age.  Neuro: Alert and oriented x3, extra-ocular muscles intact, sensation grossly intact.  HEENT: Normocephalic, atraumatic, pupils equal round reactive to light, neck supple, no masses, no painful lymphadenopathy, TM's intact B/L, no acute findings. Nares- patent, clear d/c, OP-  clear, mild erythema, No TTP sinuses Skin: Warm and dry, no gross rash. Cardiac: RRR, S1 S2,  no murmurs rubs or gallops.  Respiratory: ECTA B/L and A/P, Not using accessory muscles, speaking in full sentences- unlabored. Vascular:  No gross lower ext edema, cap RF less 2 sec. Psych: No HI/SI, judgement and insight good, Euthymic mood. Full Affect.

## 2018-02-19 ENCOUNTER — Encounter: Payer: Self-pay | Admitting: Allergy and Immunology

## 2018-03-31 ENCOUNTER — Encounter (HOSPITAL_COMMUNITY): Payer: Self-pay | Admitting: Emergency Medicine

## 2018-03-31 ENCOUNTER — Emergency Department (HOSPITAL_COMMUNITY)
Admission: EM | Admit: 2018-03-31 | Discharge: 2018-03-31 | Disposition: A | Discharge status: Home / Self Care | Payer: No Typology Code available for payment source | Attending: Emergency Medicine | Admitting: Emergency Medicine

## 2018-03-31 DIAGNOSIS — T7840XA Allergy, unspecified, initial encounter: Secondary | ICD-10-CM | POA: Insufficient documentation

## 2018-03-31 DIAGNOSIS — Z79899 Other long term (current) drug therapy: Secondary | ICD-10-CM | POA: Diagnosis not present

## 2018-03-31 DIAGNOSIS — Z87891 Personal history of nicotine dependence: Secondary | ICD-10-CM | POA: Insufficient documentation

## 2018-03-31 DIAGNOSIS — L509 Urticaria, unspecified: Secondary | ICD-10-CM | POA: Diagnosis present

## 2018-03-31 MED ORDER — METHYLPREDNISOLONE SODIUM SUCC 125 MG IJ SOLR
125.0000 mg | Freq: Once | INTRAMUSCULAR | Status: AC
  Administered 2018-03-31: 125 mg via INTRAVENOUS
  Filled 2018-03-31: qty 2

## 2018-03-31 MED ORDER — FAMOTIDINE IN NACL 20-0.9 MG/50ML-% IV SOLN
20.0000 mg | Freq: Once | INTRAVENOUS | Status: AC
  Administered 2018-03-31: 20 mg via INTRAVENOUS
  Filled 2018-03-31: qty 50

## 2018-03-31 MED ORDER — SODIUM CHLORIDE 0.9 % IV SOLN
INTRAVENOUS | Status: DC
Start: 2018-03-31 — End: 2018-03-31
  Administered 2018-03-31: 14:00:00 via INTRAVENOUS

## 2018-03-31 MED ORDER — FAMOTIDINE 20 MG PO TABS: 20.0000 mg | ORAL_TABLET | Freq: Two times a day (BID) | ORAL | 0 refills | Status: DC

## 2018-03-31 NOTE — Discharge Instructions (Signed)
Take the Pepcid for the next 7 days.  Take Benadryl at least for the next 24 hours 25 mg every 6 hours.  Continue that if still not feeling back to normal.  Return for any new or worse symptoms.  Use your EpiPen for any severe allergy symptoms that occur.

## 2018-03-31 NOTE — ED Notes (Signed)
Hives, redness, itching has improved greatly.  Per MD monitor pt for at least another hour.

## 2018-03-31 NOTE — ED Provider Notes (Signed)
[facility] EMERGENCY DEPARTMENT Provider Note   CSN: [id number] Arrival date & time: 03/31/18  1335     History   Chief Complaint Chief Complaint  Patient presents with  . Allergic Reaction    HPI Richard Chang is a 32 y.o. male.  The scanner here in a bit patient was working under his wife's car suddenly broke out in hives and facial swelling a little swelling in the throat and feeling as if his tongue was slightly swollen.  Patient does have a history of bee allergies has epi pens at home.  But he does not think he got stung by bee did not use the EpiPen.  To take 50 mg of Benadryl with some improvement but not complete improvement so they came in.  Patient denies any wheezing or prior trouble breathing.     History reviewed. No pertinent past medical history.  Patient Active Problem List   Diagnosis Date Noted  . Bilateral shoulder pain 05/23/2017  . Bilateral wrist pain 05/23/2017  . Bilateral hand pain 05/23/2017  . Healthcare maintenance 05/23/2017    No past surgical history on file.      Home Medications    Prior to Admission medications   Medication Sig Start Date End Date Taking? Authorizing Provider  EPINEPHrine 0.3 mg/0.3 mL IJ SOAJ injection Inject 0.3 mLs (0.3 mg total) into the muscle once. 05/14/15  Yes Mirian Mo, MD  fluticasone Aleda Grana) 50 MCG/ACT nasal spray 1 spray each nostril following sinus rinse twice daily 02/14/18  Yes Opalski, Gavin Pound, DO  loratadine (CLARITIN) 10 MG tablet Take 10 mg by mouth daily as needed for allergies.   Yes [provider]  famotidine (PEPCID) 20 MG tablet Take 1 tablet (20 mg total) by mouth 2 (two) times daily. 03/31/18   Vanetta Mulders, MD  fexofenadine (ALLEGRA) 180 MG tablet Take 1 tablet (180 mg total) by mouth daily. Patient not taking: Reported on 03/31/2018 02/14/18   Thomasene Lot, DO  olopatadine (PATANOL) 0.1 % ophthalmic solution Place 1 drop into both eyes 2 (two) times  daily. Patient not taking: Reported on 03/31/2018 02/14/18   Thomasene Lot, DO    Family History Family History  Problem Relation Age of Onset  . Cancer Mother   . Cancer Father        skin  . Healthy Sister   . Healthy Brother   . Healthy Son   . Healthy Brother   . Healthy Son     Social History Social History   Tobacco Use  . Smoking status: Former Smoker    Packs/day: 1.00    Years: 6.00    Pack years: 6.00    Types: Cigarettes    Last attempt to quit: 10/09/2013    Years since quitting: 4.4  . Smokeless tobacco: Former Neurosurgeon    Types: Chew    Quit date: 05/09/2017  Substance Use Topics  . Alcohol use: Yes    Comment: occ  . Drug use: No     Allergies   Bee venom; Codeine; and Penicillins   Review of Systems Review of Systems  Constitutional: Negative for fever.  HENT: Positive for facial swelling. Negative for congestion.   Eyes: Negative for visual disturbance.  Respiratory: Negative for shortness of breath and wheezing.   Cardiovascular: Negative for chest pain.  Gastrointestinal: Negative for abdominal pain.  Genitourinary: Negative for dysuria.  Musculoskeletal: Negative for back pain.  Skin: Positive for rash.  Neurological: Negative for headaches.  Hematological: Does not bruise/bleed easily.  Psychiatric/Behavioral: Negative for confusion.     Physical Exam Updated Vital Signs BP 123/72 (BP Location: Right Arm)   Pulse 62   Temp 98.7 F (37.1 C)   Resp 16   SpO2 100%   Physical Exam  Constitutional: He is oriented to person, place, and time. He appears well-developed and well-nourished. No distress.  HENT:  Head: Normocephalic and atraumatic.  Mouth/Throat: Oropharynx is clear and moist. No oropharyngeal exudate.  No significant tongue swelling.  Lips not swollen.  Some slight facial swelling  Eyes: Pupils are equal, round, and reactive to light. Conjunctivae and EOM are normal.  Neck: Neck supple.  Cardiovascular: Normal rate,  regular rhythm and normal heart sounds.  Pulmonary/Chest: Effort normal and breath sounds normal.  Abdominal: Soft. Bowel sounds are normal.  Musculoskeletal: Normal range of motion. He exhibits no edema.  Neurological: He is alert and oriented to person, place, and time. No cranial nerve deficit or sensory deficit. He exhibits normal muscle tone. Coordination normal.  Skin: Skin is warm. Rash noted.  Urticarial type rash mostly on the back some on the neck anterior chest some scattered on the arms lower extremities not strictly involved.  Face involved.  Nursing note and vitals reviewed.    ED Treatments / Results  Labs (all labs ordered are listed, but only abnormal results are displayed) Labs Reviewed - No data to display  EKG None  Radiology No results found.  Procedures Procedures (including critical care time)  CRITICAL CARE Performed by: Sydell Prowell Total critical care time: 30 minutes Critical care time was exclusive of separately billable procedures and treating other patients. Critical care was necessary to treat or prevent imminent or life-threatening deterioration. Critical care was time spent personally by me on the following activities: development of treatment plan with patient and/or surrogate as well as nursing, discussions with consultants, evaluation of patient's response to treatment, examination of patient, obtaining history from patient or surrogate, ordering and performing treatments and interventions, ordering and review of laboratory studies, ordering and review of radiographic studies, pulse oximetry and re-evaluation of patient's condition.   Medications Ordered in ED Medications  0.9 %  sodium chloride infusion ( Intravenous Rate/Dose Verify 03/31/18 1419)  famotidine (PEPCID) IVPB 20 mg premix ( Intravenous Stopped 03/31/18 1451)  methylPREDNISolone sodium succinate (SOLU-MEDROL) 125 mg/2 mL injection 125 mg (125 mg Intravenous Given 03/31/18 1417)       Initial Impression / Assessment and Plan / ED Course  I have reviewed the triage vital signs and the nursing notes.  Pertinent labs & imaging results that were available during my care of the patient were reviewed by me and considered in my medical decision making (see chart for details).    Patient received Solu-Medrol and Pepcid here IV patient already had 50 mg Benadryl at home.  Significant improvement over 2 hours of the rash and swelling.  Patient states that face still feels slightly swollen at the time of discharge but everything else is feeling much much better.  Patient does not like taking prednisone is had bad reactions in the past so he does will continue Benadryl for the next 24 hours and Pepcid for the next 7 days he does have epi-pens at home.  Patient stable for discharge home.   Final Clinical Impressions(s) / ED Diagnoses   Final diagnoses:  Allergic reaction, initial encounter    ED Discharge Orders        Ordered    famotidine (PEPCID)  20 MG tablet  2 times daily     03/31/18 1637       Vanetta Mulders, MD 03/31/18 1654

## 2018-03-31 NOTE — ED Triage Notes (Signed)
Pt was working under a car and he is unsure if something bit him but his face is red, has hives, has slight dry cough, throat feels a little tight. Pt has dip in his mouth so hard to assess swelling. Refused to remove it.

## 2018-06-11 ENCOUNTER — Encounter (HOSPITAL_BASED_OUTPATIENT_CLINIC_OR_DEPARTMENT_OTHER): Payer: Self-pay | Admitting: Emergency Medicine

## 2018-06-11 ENCOUNTER — Emergency Department (HOSPITAL_BASED_OUTPATIENT_CLINIC_OR_DEPARTMENT_OTHER)
Admission: EM | Admit: 2018-06-11 | Discharge: 2018-06-11 | Disposition: A | Discharge status: Home / Self Care | Payer: No Typology Code available for payment source | Attending: Emergency Medicine | Admitting: Emergency Medicine

## 2018-06-11 ENCOUNTER — Other Ambulatory Visit: Payer: Self-pay

## 2018-06-11 DIAGNOSIS — Z87891 Personal history of nicotine dependence: Secondary | ICD-10-CM | POA: Insufficient documentation

## 2018-06-11 DIAGNOSIS — T63461A Toxic effect of venom of wasps, accidental (unintentional), initial encounter: Secondary | ICD-10-CM | POA: Diagnosis not present

## 2018-06-11 DIAGNOSIS — T7840XA Allergy, unspecified, initial encounter: Secondary | ICD-10-CM | POA: Diagnosis not present

## 2018-06-11 DIAGNOSIS — Z79899 Other long term (current) drug therapy: Secondary | ICD-10-CM | POA: Diagnosis not present

## 2018-06-11 DIAGNOSIS — R202 Paresthesia of skin: Secondary | ICD-10-CM | POA: Diagnosis present

## 2018-06-11 MED ORDER — DIPHENHYDRAMINE HCL 50 MG/ML IJ SOLN
50.0000 mg | Freq: Once | INTRAMUSCULAR | Status: AC
  Administered 2018-06-11: 50 mg via INTRAVENOUS
  Filled 2018-06-11: qty 1

## 2018-06-11 MED ORDER — FAMOTIDINE 20 MG PO TABS: 20.0000 mg | ORAL_TABLET | Freq: Two times a day (BID) | ORAL | 0 refills | Status: DC

## 2018-06-11 MED ORDER — PREDNISONE 20 MG PO TABS: 40.0000 mg | ORAL_TABLET | Freq: Every day | ORAL | 0 refills | Status: AC

## 2018-06-11 MED ORDER — METHYLPREDNISOLONE SODIUM SUCC 125 MG IJ SOLR
125.0000 mg | Freq: Once | INTRAMUSCULAR | Status: AC
  Administered 2018-06-11: 125 mg via INTRAVENOUS
  Filled 2018-06-11: qty 2

## 2018-06-11 MED ORDER — FAMOTIDINE IN NACL 20-0.9 MG/50ML-% IV SOLN
20.0000 mg | Freq: Once | INTRAVENOUS | Status: AC
  Administered 2018-06-11: 20 mg via INTRAVENOUS
  Filled 2018-06-11: qty 50

## 2018-06-11 MED ORDER — SODIUM CHLORIDE 0.9 % IV BOLUS
1000.0000 mL | Freq: Once | INTRAVENOUS | Status: AC
  Administered 2018-06-11: 1000 mL via INTRAVENOUS

## 2018-06-11 MED ORDER — EPINEPHRINE 0.3 MG/0.3ML IJ SOAJ: 0.3000 mg | Freq: Once | INTRAMUSCULAR | 0 refills | Status: AC

## 2018-06-11 NOTE — Discharge Instructions (Signed)
Locally your reaction today seems to be much more mild than in the past.  Continue with prednisone, Pepcid and Benadryl for the next 3 to 5 days as reaction continues to improve.  I have prescribed another EpiPen for you if you are exposed to another bee sting or have more severe allergic reaction symptoms with swelling of the tongue or lips, difficulty breathing or any other more severe symptoms you may use this and then report to the emergency department for further evaluation.

## 2018-06-11 NOTE — ED Triage Notes (Signed)
Pt reports bee sting to RT temple at approx 0830

## 2018-06-11 NOTE — ED Provider Notes (Signed)
[facility] EMERGENCY DEPARTMENT Provider Note   CSN: [id number] Arrival date & time: 06/11/18  0930     History   Chief Complaint Chief Complaint  Patient presents with  . Insect Bite    HPI Richard Chang is a 32 y.o. male.  Richard Chang is a 31 y.o. Male with a history of previous anaphylaxis, presents to the ED for evaluation of yellowjacket sting to the right temple at approximately 830 this morning.  She reports since the sting occurred he has had increasing swelling and redness in this area.  He reports his lips feel little bit tingly but he has not had any swelling of the lips or tongue, no sore scratchy throat or sensation of throat closing, no difficulty breathing or swallowing, no wheezing or chest pain.  Reports a faint rash over his upper chest.  Patient denies lightheadedness or syncope.  No nausea, vomiting or abdominal pain.  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      History reviewed. No pertinent past medical history.  Patient Active Problem List   Diagnosis Date Noted  . Bilateral shoulder pain 05/23/2017  . Bilateral wrist pain 05/23/2017  . Bilateral hand pain 05/23/2017  . Healthcare maintenance 05/23/2017    History reviewed. No pertinent surgical history.      Home Medications    Prior to Admission medications   Medication Sig Start Date End Date Taking? Authorizing Provider  EPINEPHrine 0.3 mg/0.3 mL IJ SOAJ injection Inject 0.3 mLs (0.3 mg total) into the muscle once. 05/14/15   Mirian Mo, MD  famotidine (PEPCID) 20 MG tablet Take 1 tablet (20 mg total) by mouth 2 (two) times daily. 03/31/18   Vanetta Mulders, MD  fexofenadine (ALLEGRA) 180 MG tablet Take 1 tablet (180 mg total) by mouth daily. Patient not taking: Reported on 03/31/2018 02/14/18    Thomasene Lot, DO  fluticasone [facility]) 50 MCG/ACT nasal spray 1 spray each nostril following sinus rinse twice daily 02/14/18   Opalski, Gavin Pound, DO  loratadine (CLARITIN) 10 MG tablet Take 10 mg by mouth daily as needed for allergies.    [provider]  olopatadine (PATANOL) 0.1 % ophthalmic solution Place 1 drop into both eyes 2 (two) times daily. Patient not taking: Reported on 03/31/2018 02/14/18   Thomasene Lot, DO    Family History Family History  Problem Relation Age of Onset  . Cancer Mother   . Cancer Father        skin  . Healthy Sister   . Healthy Brother   . Healthy Son   . Healthy Brother   . Healthy Son     Social History Social History   Tobacco Use  . Smoking status: Former Smoker    Packs/day: 1.00    Years: 6.00    Pack years: 6.00    Types: Cigarettes    Last attempt to quit: 10/09/2013    Years since quitting: 4.6  . Smokeless tobacco: Former Neurosurgeon    Types: Chew    Quit date: 05/09/2017  Substance Use Topics  . Alcohol use: Yes    Comment: occ  . Drug use: No     Allergies   Bee venom; Codeine; and Penicillins   Review of Systems Review of Systems  Constitutional: Negative for chills and fever.  HENT: Positive for facial swelling. Negative for congestion, drooling, ear pain, rhinorrhea, sinus pain, sore throat, trouble swallowing and voice change.   Eyes: Negative for visual disturbance.  Respiratory: Negative for cough and shortness of breath.   Cardiovascular: Negative for chest pain.  Gastrointestinal: Negative for nausea and vomiting.  Musculoskeletal: Negative for arthralgias and myalgias.  Skin: Positive for color change and rash. Negative for pallor and wound.  Neurological: Negative for dizziness, light-headedness and headaches.  All other systems reviewed and are negative.    Physical Exam Updated Vital Signs BP 122/75 (BP Location: Left Arm)   Pulse 74   Temp 98.2 F (36.8 C) (Oral)   Resp 18   Ht 5\' 7"  (1.702  m)   Wt 77.1 kg   SpO2 100%   BMI 26.63 kg/m   Physical Exam  Constitutional: He is oriented to person, place, and time. He appears well-developed and well-nourished. No distress.  HENT:  Head: Normocephalic and atraumatic.  Erythema and swelling over the right temple, does not extend down towards the mouth, there is no angioedema of the lips or tongue, posterior oropharynx is clear and moist, normal phonation, no trismus  Eyes: Pupils are equal, round, and reactive to light. EOM are normal. Right eye exhibits no discharge. Left eye exhibits no discharge.  Facial swelling over the right temple does not involve the right eye or eyelid, normal extraocular motions, no eye pain  Neck: Normal range of motion. Neck supple.  No swelling noted, no stridor on auscultation  Pulmonary/Chest: Effort normal and breath sounds normal. No stridor. No respiratory distress. He has no wheezes. He has no rales.  She appears to be in no respiratory distress. Respirations equal and unlabored, patient able to speak in full sentences, lungs clear to auscultation bilaterally  Abdominal: Soft. Bowel sounds are normal. He exhibits no distension and no mass. There is no tenderness. There is no guarding.  Abdomen soft, nondistended, nontender to palpation in all quadrants without guarding or peritoneal signs  Musculoskeletal: He exhibits no edema or deformity.  Neurological: He is alert and oriented to person, place, and time. Coordination normal.  Skin: Skin is warm and dry. He is not diaphoretic.  Faint erythematous macular rash noted to the upper chest, no rash noted elsewhere  Psychiatric: He has a normal mood and affect. His behavior is normal.  Nursing note and vitals reviewed.    ED Treatments / Results  Labs (all labs ordered are listed, but only abnormal results are displayed) Labs Reviewed - No data to display  EKG None  Radiology No results found.  Procedures Procedures (including critical care  time)  Medications Ordered in ED Medications  sodium chloride 0.9 % bolus 1,000 mL (has no administration in time range)  methylPREDNISolone sodium succinate (SOLU-MEDROL) 125 mg/2 mL injection 125 mg (has no administration in time range)  famotidine (PEPCID) IVPB 20 mg premix (has no administration in time range)  diphenhydrAMINE (BENADRYL) injection 50 mg (has no administration in time range)     Initial Impression / Assessment and Plan / ED Course  I have reviewed the triage vital signs and the nursing notes.  Pertinent labs & imaging results that were available during my care of the patient were reviewed by me and considered in my medical decision making (see chart for details).  Presents to the emergency department after yellowjacket sting to the right temple at approximately 830 this a.m., history of anaphylaxis in the past to stings, no epi prior to  arrival.  No angioedema of the lips or tongue, posterior oropharynx is clear, no stridor and lungs clear and patient appears to be in no respiratory distress, faint rash over the upper chest, no nausea or vomiting, no lightheadedness or syncope.  Likely this appears to be a much more mild reaction compared to patient's previous.  Will give steroids, Pepcid and Benadryl and observed here in the ED for any progression of symptoms.  Patient has been observed in the ED for 90 minutes after receiving medications with improvement in erythema and swelling and no worsening or progression of symptoms, airway remains intact with no increased work of breathing, lungs clear, no facial swelling.  Will discharge with continued steroids, Pepcid and Benadryl for the next few days.  Provided with refill on EpiPen.  Return precautions discussed.  Patient expresses understanding and is in agreement with plan.  Final Clinical Impressions(s) / ED Diagnoses   Final diagnoses:  Yellow jacket sting, accidental or unintentional, initial encounter  Allergic  reaction, initial encounter    ED Discharge Orders         Ordered    predniSONE (DELTASONE) 20 MG tablet  Daily     06/11/18 1141    famotidine (PEPCID) 20 MG tablet  2 times daily     06/11/18 1141    EPINEPHrine 0.3 mg/0.3 mL IJ SOAJ injection   Once     06/11/18 1141           Dartha Lodge, PA-C 06/11/18 1144    Alvira Monday, MD 06/12/18 1457

## 2018-09-24 ENCOUNTER — Ambulatory Visit (INDEPENDENT_AMBULATORY_CARE_PROVIDER_SITE_OTHER): Payer: No Typology Code available for payment source | Admitting: Adult Health

## 2018-09-24 ENCOUNTER — Encounter: Payer: Self-pay | Admitting: Adult Health

## 2018-09-24 VITALS — BP 100/54 | HR 77 | Temp 98.5°F | Ht 66.25 in | Wt 162.0 lb

## 2018-09-24 DIAGNOSIS — R5383 Other fatigue: Secondary | ICD-10-CM | POA: Diagnosis not present

## 2018-09-24 DIAGNOSIS — R197 Diarrhea, unspecified: Secondary | ICD-10-CM | POA: Diagnosis not present

## 2018-09-24 DIAGNOSIS — Z0000 Encounter for general adult medical examination without abnormal findings: Secondary | ICD-10-CM | POA: Diagnosis not present

## 2018-09-24 DIAGNOSIS — J0100 Acute maxillary sinusitis, unspecified: Secondary | ICD-10-CM

## 2018-09-24 MED ORDER — FLUTICASONE PROPIONATE 50 MCG/ACT NA SUSP: 2.0000 | Freq: Every day | NASAL | 2 refills | Status: DC

## 2018-09-24 MED ORDER — DOXYCYCLINE HYCLATE 100 MG PO TABS: 100.0000 mg | ORAL_TABLET | Freq: Two times a day (BID) | ORAL | 0 refills | Status: DC

## 2018-09-24 MED ORDER — BENZONATATE 200 MG PO CAPS: 200.0000 mg | ORAL_CAPSULE | Freq: Two times a day (BID) | ORAL | 0 refills | Status: DC | PRN

## 2018-09-24 NOTE — Assessment & Plan Note (Addendum)
CBC drawn CMP drawn

## 2018-09-24 NOTE — Assessment & Plan Note (Signed)
Follow BRAT diet. Please complete and return Stool Cards. Referral to Gastroenterologist placed. We will call with lab results.

## 2018-09-24 NOTE — Patient Instructions (Signed)
Sinusitis, Adult Sinusitis is soreness and inflammation of your sinuses. Sinuses are hollow spaces in the bones around your face. Your sinuses are located:  Around your eyes.  In the middle of your forehead.  Behind your nose.  In your cheekbones.  Your sinuses and nasal passages are lined with a stringy fluid (mucus). Mucus normally drains out of your sinuses. When your nasal tissues become inflamed or swollen, the mucus can become trapped or blocked so air cannot flow through your sinuses. This allows bacteria, viruses, and funguses to grow, which leads to infection. Sinusitis can develop quickly and last for 7?10 days (acute) or for more than 12 weeks (chronic). Sinusitis often develops after a cold. What are the causes? This condition is caused by anything that creates swelling in the sinuses or stops mucus from draining, including:  Allergies.  Asthma.  Bacterial or viral infection.  Abnormally shaped bones between the nasal passages.  Nasal growths that contain mucus (nasal polyps).  Narrow sinus openings.  Pollutants, such as chemicals or irritants in the air.  A foreign object stuck in the nose.  A fungal infection. This is rare.  What increases the risk? The following factors may make you more likely to develop this condition:  Having allergies or asthma.  Having had a recent cold or respiratory tract infection.  Having structural deformities or blockages in your nose or sinuses.  Having a weak immune system.  Doing a lot of swimming or diving.  Overusing nasal sprays.  Smoking.  What are the signs or symptoms? The main symptoms of this condition are pain and a feeling of pressure around the affected sinuses. Other symptoms include:  Upper toothache.  Earache.  Headache.  Bad breath.  Decreased sense of smell and taste.  A cough that may get worse at night.  Fatigue.  Fever.  Thick drainage from your nose. The drainage is often green and  it may contain pus (purulent).  Stuffy nose or congestion.  Postnasal drip. This is when extra mucus collects in the throat or back of the nose.  Swelling and warmth over the affected sinuses.  Sore throat.  Sensitivity to light.  How is this diagnosed? This condition is diagnosed based on symptoms, a medical history, and a physical exam. To find out if your condition is acute or chronic, your health care provider may:  Look in your nose for signs of nasal polyps.  Tap over the affected sinus to check for signs of infection.  View the inside of your sinuses using an imaging device that has a light attached (endoscope).  If your health care provider suspects that you have chronic sinusitis, you may also:  Be tested for allergies.  Have a sample of mucus taken from your nose (nasal culture) and checked for bacteria.  Have a mucus sample examined to see if your sinusitis is related to an allergy.  If your sinusitis does not respond to treatment and it lasts longer than 8 weeks, you may have an MRI or CT scan to check your sinuses. These scans also help to determine how severe your infection is. In rare cases, a bone biopsy may be done to rule out more serious types of fungal sinus disease. How is this treated? Treatment for sinusitis depends on the cause and whether your condition is chronic or acute. If a virus is causing your sinusitis, your symptoms will go away on their own within 10 days. You may be given medicines to relieve your symptoms,   including:  Topical nasal decongestants. They shrink swollen nasal passages and let mucus drain from your sinuses.  Antihistamines. These drugs block inflammation that is triggered by allergies. This can help to ease swelling in your nose and sinuses.  Topical nasal corticosteroids. These are nasal sprays that ease inflammation and swelling in your nose and sinuses.  Nasal saline washes. These rinses can help to get rid of thick mucus in  your nose.  If your condition is caused by bacteria, you will be given an antibiotic medicine. If your condition is caused by a fungus, you will be given an antifungal medicine. Surgery may be needed to correct underlying conditions, such as narrow nasal passages. Surgery may also be needed to remove polyps. Follow these instructions at home: Medicines  Take, use, or apply over-the-counter and prescription medicines only as told by your health care provider. These may include nasal sprays.  If you were prescribed an antibiotic medicine, take it as told by your health care provider. Do not stop taking the antibiotic even if you start to feel better. Hydrate and Humidify  Drink enough water to keep your urine clear or pale yellow. Staying hydrated will help to thin your mucus.  Use a cool mist humidifier to keep the humidity level in your home above 50%.  Inhale steam for 10-15 minutes, 3-4 times a day or as told by your health care provider. You can do this in the bathroom while a hot shower is running.  Limit your exposure to cool or dry air. Rest  Rest as much as possible.  Sleep with your head raised (elevated).  Make sure to get enough sleep each night. General instructions  Apply a warm, moist washcloth to your face 3-4 times a day or as told by your health care provider. This will help with discomfort.  Wash your hands often with soap and water to reduce your exposure to viruses and other germs. If soap and water are not available, use hand sanitizer.  Do not smoke. Avoid being around people who are smoking (secondhand smoke).  Keep all follow-up visits as told by your health care provider. This is important. Contact a health care provider if:  You have a fever.  Your symptoms get worse.  Your symptoms do not improve within 10 days. Get help right away if:  You have a severe headache.  You have persistent vomiting.  You have pain or swelling around your face or  eyes.  You have vision problems.  You develop confusion.  Your neck is stiff.  You have trouble breathing. This information is not intended to replace advice given to you by your health care provider. Make sure you discuss any questions you have with your health care provider. Document Released: 09/25/2005 Document Revised: 05/21/2016 Document Reviewed: 07/21/2015 Elsevier Interactive Patient Education  2018 [employer].   Diarrhea, Adult Diarrhea is frequent loose and watery bowel movements. Diarrhea can make you feel weak and cause you to become dehydrated. Dehydration can make you tired and thirsty, cause you to have a dry mouth, and decrease how often you urinate. Diarrhea typically lasts 2-3 days. However, it can last longer if it is a sign of something more serious. It is important to treat your diarrhea as told by your health care provider. Follow these instructions at home: Eating and drinking  Follow these recommendations as told by your health care provider:  Take an oral rehydration solution (ORS). This is a drink that is sold at pharmacies  and retail stores.  Drink clear fluids, such as water, ice chips, diluted fruit juice, and low-calorie sports drinks.  Eat bland, easy-to-digest foods in small amounts as you are able. These foods include bananas, applesauce, rice, lean meats, toast, and crackers.  Avoid drinking fluids that contain a lot of sugar or caffeine, such as energy drinks, sports drinks, and soda.  Avoid alcohol.  Avoid spicy or fatty foods.  General instructions  Drink enough fluid to keep your urine clear or pale yellow.  Wash your hands often. If soap and water are not available, use hand sanitizer.  Make sure that all people in your household wash their hands well and often.  Take over-the-counter and prescription medicines only as told by your health care provider.  Rest at home while you recover.  Watch your condition for any  changes.  Take a warm bath to relieve any burning or pain from frequent diarrhea episodes.  Keep all follow-up visits as told by your health care provider. This is important. Contact a health care provider if:  You have a fever.  Your diarrhea gets worse.  You have new symptoms.  You cannot keep fluids down.  You feel light-headed or dizzy.  You have a headache  You have muscle cramps. Get help right away if:  You have chest pain.  You feel extremely weak or you faint.  You have bloody or black stools or stools that look like tar.  You have severe pain, cramping, or bloating in your abdomen.  You have trouble breathing or you are breathing very quickly.  Your heart is beating very quickly.  Your skin feels cold and clammy.  You feel confused.  You have signs of dehydration, such as: ? Dark urine, very little urine, or no urine. ? Cracked lips. ? Dry mouth. ? Sunken eyes. ? Sleepiness. ? Weakness. This information is not intended to replace advice given to you by your health care provider. Make sure you discuss any questions you have with your health care provider. Document Released: 09/15/2002 Document Revised: 02/03/2016 Document Reviewed: 06/01/2015 Elsevier Interactive Patient Education  2018 [employer]   [place] Diet A bland diet consists of foods that do not have a lot of fat or fiber. Foods without fat or fiber are easier for the body to digest. They are also less likely to irritate your mouth, throat, stomach, and other parts of your gastrointestinal tract. A bland diet is sometimes called a BRAT diet. What is my plan? Your health care provider or dietitian may recommend specific changes to your diet to prevent and treat your symptoms, such as:  Eating small meals often.  Cooking food until it is soft enough to chew easily.  Chewing your food well.  Drinking fluids slowly.  Not eating foods that are very spicy, sour, or fatty.  Not eating  citrus fruits, such as oranges and grapefruit.  What do I need to know about this diet?  Eat a variety of foods from the bland diet food list.  Do not follow a bland diet longer than you have to.  Ask your health care provider whether you should take vitamins. What foods can I eat? Grains  Hot cereals, such as cream of wheat. Bread, crackers, or tortillas made from refined white flour. Rice. Vegetables Canned or cooked vegetables. Mashed or boiled potatoes. Fruits Bananas. Applesauce. Other types of cooked or canned fruit with the skin and seeds removed, such as canned peaches or pears. Meats and Other Protein  Sources Scrambled eggs. Creamy peanut butter or other nut butters. Lean, well-cooked meats, such as chicken or fish. Tofu. Soups or broths. Dairy Low-fat dairy products, such as milk, cottage cheese, or yogurt. Beverages Water. Herbal tea. Apple juice. Sweets and Desserts Pudding. Custard. Fruit gelatin. Ice cream. Fats and Oils Mild salad dressings. Canola or olive oil. The items listed above may not be a complete list of allowed foods or beverages. Contact your dietitian for more options. What foods are not recommended? Foods and ingredients that are often not recommended include:  Spicy foods, such as hot sauce or salsa.  Fried foods.  Sour foods, such as pickled or fermented foods.  Raw vegetables or fruits, especially citrus or berries.  Caffeinated drinks.  Alcohol.  Strongly flavored seasonings or condiments.  The items listed above may not be a complete list of foods and beverages that are not allowed. Contact your dietitian for more information. This information is not intended to replace advice given to you by your health care provider. Make sure you discuss any questions you have with your health care provider. Document Released: 01/17/2016 Document Revised: 03/02/2016 Document Reviewed: 10/07/2014 Elsevier Interactive Patient Education  2018 [employer].  Please take Doxycycline as directed. Please take Tessalon and Flonase as needed. Follow BRAT diet. Please complete and return Stool Cards. Referral to Gastroenterologist placed. We will call with lab results. Alternate OTC Acetaminophen and Ibuprofen as needed for fever and discomfort. If symptoms persist after antibiotic completed, please call clinic. Please schedule complete physical with fasting labs in 6 weeks. FEEL BETTER!

## 2018-09-24 NOTE — Assessment & Plan Note (Signed)
Please take Doxycycline as directed. Please take Tessalon and Flonase as needed. Alternate OTC Acetaminophen and Ibuprofen as needed for fever and discomfort.

## 2018-09-24 NOTE — Progress Notes (Signed)
Subjective:    Patient ID: Richard Chang, male    DOB: 05-06-1986, 32 y.o.   MRN: [id number]  HPI:  [name] presents with several issues: 1) sore throat (3/10) 2) copious nasal drainage with sinus pressure 3) non-productive cough and fatigue These have been occurring for >1.5 weeks and steadily worsening.  He has been increasing fluids (water, gatorade, juice), however he continues to dip- dip can in exam room during OV 4) Diarrhea without hematochezia for the last 4 weeks, he estimates 2- 4 loose stools/day. He has lost 9 lbs since last OV in May 2019 He denies abd pain He denies first degree family relative with colon ca  He denies hx of previous diarrhea He has never been seen for CPE since establishing with practice, only acute issues   Patient Care Team    Relationship Specialty Notifications Start End  Julaine Fusi, NP PCP - General Family Medicine  02/04/18     Patient Active Problem List   Diagnosis Date Noted  . Diarrhea 09/24/2018  . Acute maxillary sinusitis 09/24/2018  . Other fatigue 09/24/2018  . Bilateral shoulder pain 05/23/2017  . Bilateral wrist pain 05/23/2017  . Bilateral hand pain 05/23/2017  . Healthcare maintenance 05/23/2017     History reviewed. No pertinent past medical history.   History reviewed. No pertinent surgical history.   Family History  Problem Relation Age of Onset  . Cancer Mother   . Cancer Father        skin  . Healthy Sister   . Healthy Brother   . Healthy Son   . Healthy Brother   . Healthy Son      Social History   Substance and Sexual Activity  Drug Use No     Social History   Substance and Sexual Activity  Alcohol Use Yes   Comment: occ     Social History   Tobacco Use  Smoking Status Former Smoker  . Packs/day: 1.00  . Years: 6.00  . Pack years: 6.00  . Types: Cigarettes  . Last attempt to quit: 10/09/2013  . Years since quitting: 4.9  Smokeless Tobacco Former Neurosurgeon  . Types: Chew  .  Quit date: 05/09/2017     Outpatient Encounter Medications as of 09/24/2018  Medication Sig  . EPINEPHrine 0.3 mg/0.3 mL IJ SOAJ injection Inject 0.3 mLs (0.3 mg total) into the muscle once.  . fexofenadine (ALLEGRA) 180 MG tablet Take 1 tablet (180 mg total) by mouth daily.  . benzonatate (TESSALON) 200 MG capsule Take 1 capsule (200 mg total) by mouth 2 (two) times daily as needed for cough.  . doxycycline (VIBRA-TABS) 100 MG tablet Take 1 tablet (100 mg total) by mouth 2 (two) times daily.  . fluticasone (FLONASE) 50 MCG/ACT nasal spray Place 2 sprays into both nostrils daily.  . [DISCONTINUED] famotidine (PEPCID) 20 MG tablet Take 1 tablet (20 mg total) by mouth 2 (two) times daily.  . [DISCONTINUED] fluticasone (FLONASE) 50 MCG/ACT nasal spray 1 spray each nostril following sinus rinse twice daily  . [DISCONTINUED] loratadine (CLARITIN) 10 MG tablet Take 10 mg by mouth daily as needed for allergies.  . [DISCONTINUED] olopatadine (PATANOL) 0.1 % ophthalmic solution Place 1 drop into both eyes 2 (two) times daily.   No facility-administered encounter medications on file as of 09/24/2018.     Allergies: Bee venom; Codeine; and Penicillins  Body mass index is 25.95 kg/m.  Blood pressure (!) 100/54, pulse 77, temperature 98.5 F (36.9  C), temperature source Oral, height 5' 6.25" (1.683 m), weight 162 lb (73.5 kg), SpO2 99 %.  Review of Systems  Constitutional: Positive for activity change, appetite change, chills and fatigue. Negative for diaphoresis, fever and unexpected weight change.  HENT: Positive for congestion.   Eyes: Negative for visual disturbance.  Respiratory: Positive for cough. Negative for chest tightness, shortness of breath and wheezing.   Cardiovascular: Negative for chest pain, palpitations and leg swelling.  Gastrointestinal: Positive for diarrhea. Negative for abdominal distention, anal bleeding, blood in stool, constipation, nausea and vomiting.  Endocrine:  Negative for cold intolerance, heat intolerance, polydipsia, polyphagia and polyuria.  Genitourinary: Negative for difficulty urinating.  Neurological: Negative for dizziness and headaches.  Hematological: Does not bruise/bleed easily.  Psychiatric/Behavioral: Positive for sleep disturbance.       Objective:   Physical Exam Constitutional:      Appearance: He is ill-appearing.  HENT:     Head: Normocephalic and atraumatic.     Right Ear: Hearing normal. No decreased hearing noted. Tympanic membrane is bulging. Tympanic membrane is not erythematous.     Left Ear: Hearing normal. No decreased hearing noted. Tympanic membrane is bulging. Tympanic membrane is not erythematous.     Nose:     Right Sinus: Maxillary sinus tenderness present.     Left Sinus: Maxillary sinus tenderness present.     Mouth/Throat:     Mouth: Mucous membranes are dry.     Pharynx: Uvula midline. No oropharyngeal exudate or posterior oropharyngeal erythema.     Tonsils: No tonsillar exudate. Swelling: 1+ on the right. 1+ on the left.  Eyes:     Extraocular Movements: Extraocular movements intact.     Conjunctiva/sclera: Conjunctivae normal.     Pupils: Pupils are equal, round, and reactive to light.  Cardiovascular:     Rate and Rhythm: Normal rate.     Heart sounds: Normal heart sounds. No murmur.  Pulmonary:     Effort: Pulmonary effort is normal.     Breath sounds: Normal breath sounds. No decreased breath sounds, wheezing, rhonchi or rales.  Abdominal:     General: Abdomen is flat. Bowel sounds are normal. There is no distension.     Palpations: Abdomen is soft. There is no mass.     Tenderness: There is no abdominal tenderness. There is no right CVA tenderness or guarding.  Skin:    General: Skin is warm and dry.     Capillary Refill: Capillary refill takes less than 2 seconds.  Neurological:     Mental Status: He is alert and oriented to person, place, and time.  Psychiatric:        Mood and  Affect: Mood normal.        Behavior: Behavior normal.        Thought Content: Thought content normal.        Judgment: Judgment normal.       Assessment & Plan:   1. Healthcare maintenance   2. Diarrhea, unspecified type   3. Other fatigue   4. Acute maxillary sinusitis, recurrence not specified     Acute maxillary sinusitis Please take Doxycycline as directed. Please take Tessalon and Flonase as needed. Alternate OTC Acetaminophen and Ibuprofen as needed for fever and discomfort.   Diarrhea Follow BRAT diet. Please complete and return Stool Cards. Referral to Gastroenterologist placed. We will call with lab results.  Other fatigue CBC drawn CMP drawn  Pt was in the office today for 25+ minutes, I spent >75%  of time in face to face counseling of patient's various medical conditions and in coordination of care  FOLLOW-UP:  Return in about 6 weeks (around 11/05/2018) for CPE, Fasting Labs.

## 2018-09-25 LAB — COMPREHENSIVE METABOLIC PANEL
ALBUMIN: 4.7 g/dL (ref 3.5–5.5)
ALK PHOS: 80 IU/L (ref 39–117)
ALT: 9 IU/L (ref 0–44)
AST: 10 IU/L (ref 0–40)
Albumin/Globulin Ratio: 1.7 (ref 1.2–2.2)
BUN / CREAT RATIO: 9 (ref 9–20)
BUN: 9 mg/dL (ref 6–20)
Bilirubin Total: 0.3 mg/dL (ref 0.0–1.2)
CHLORIDE: 102 mmol/L (ref 96–106)
CO2: 24 mmol/L (ref 20–29)
CREATININE: 0.98 mg/dL (ref 0.76–1.27)
Calcium: 9.4 mg/dL (ref 8.7–10.2)
GFR calc Af Amer: 118 mL/min/{1.73_m2} (ref >59)
GFR calc non Af Amer: 102 mL/min/{1.73_m2} (ref >59)
GLUCOSE: 110 mg/dL — AB (ref 65–99)
Globulin, Total: 2.7 g/dL (ref 1.5–4.5)
Potassium: 3.9 mmol/L (ref 3.5–5.2)
Sodium: 144 mmol/L (ref 134–144)
TOTAL PROTEIN: 7.4 g/dL (ref 6.0–8.5)

## 2018-09-25 LAB — CBC
HEMATOCRIT: 43.9 % (ref 37.5–51.0)
HEMOGLOBIN: 14.8 g/dL (ref 13.0–17.7)
MCH: 29.5 pg (ref 26.6–33.0)
MCHC: 33.7 g/dL (ref 31.5–35.7)
MCV: 88 fL (ref 79–97)
Platelets: 212 10*3/uL (ref 150–450)
RBC: 5.02 x10E6/uL (ref 4.14–5.80)
RDW: 12.5 % (ref 12.3–15.4)
WBC: 7.4 10*3/uL (ref 3.4–10.8)

## 2018-11-18 ENCOUNTER — Encounter: Payer: Self-pay | Admitting: Gastroenterology

## 2018-12-04 ENCOUNTER — Encounter: Payer: Self-pay | Admitting: Gastroenterology

## 2018-12-04 ENCOUNTER — Ambulatory Visit: Payer: No Typology Code available for payment source | Admitting: Gastroenterology

## 2018-12-04 ENCOUNTER — Other Ambulatory Visit (INDEPENDENT_AMBULATORY_CARE_PROVIDER_SITE_OTHER): Payer: No Typology Code available for payment source

## 2018-12-04 VITALS — BP 116/72 | HR 84 | Ht 67.0 in | Wt 162.0 lb

## 2018-12-04 DIAGNOSIS — R197 Diarrhea, unspecified: Secondary | ICD-10-CM

## 2018-12-04 DIAGNOSIS — R634 Abnormal weight loss: Secondary | ICD-10-CM

## 2018-12-04 LAB — SEDIMENTATION RATE: Sed Rate: 3 mm/hr (ref 0–15)

## 2018-12-04 LAB — CBC WITH DIFFERENTIAL/PLATELET
BASOS ABS: 0 10*3/uL (ref 0.0–0.1)
Basophils Relative: 0.7 % (ref 0.0–3.0)
EOS ABS: 0 10*3/uL (ref 0.0–0.7)
Eosinophils Relative: 0.8 % (ref 0.0–5.0)
HCT: 43.3 % (ref 39.0–52.0)
Hemoglobin: 15.1 g/dL (ref 13.0–17.0)
LYMPHS PCT: 22.8 % (ref 12.0–46.0)
Lymphs Abs: 1.4 10*3/uL (ref 0.7–4.0)
MCHC: 34.8 g/dL (ref 30.0–36.0)
MCV: 84.5 fl (ref 78.0–100.0)
MONO ABS: 0.6 10*3/uL (ref 0.1–1.0)
Monocytes Relative: 9.9 % (ref 3.0–12.0)
NEUTROS ABS: 4 10*3/uL (ref 1.4–7.7)
Neutrophils Relative %: 65.8 % (ref 43.0–77.0)
Platelets: 215 10*3/uL (ref 150.0–400.0)
RBC: 5.12 Mil/uL (ref 4.22–5.81)
RDW: 13.1 % (ref 11.5–15.5)
WBC: 6.1 10*3/uL (ref 4.0–10.5)

## 2018-12-04 LAB — COMPREHENSIVE METABOLIC PANEL
ALBUMIN: 4.6 g/dL (ref 3.5–5.2)
ALT: 12 U/L (ref 0–53)
AST: 13 U/L (ref 0–37)
Alkaline Phosphatase: 71 U/L (ref 39–117)
BILIRUBIN TOTAL: 0.6 mg/dL (ref 0.2–1.2)
BUN: 12 mg/dL (ref 6–23)
CHLORIDE: 102 meq/L (ref 96–112)
CO2: 31 meq/L (ref 19–32)
CREATININE: 0.96 mg/dL (ref 0.40–1.50)
Calcium: 9.7 mg/dL (ref 8.4–10.5)
GFR: 90.99 mL/min (ref >60.00)
Glucose, Bld: 77 mg/dL (ref 70–99)
Potassium: 4.4 mEq/L (ref 3.5–5.1)
SODIUM: 141 meq/L (ref 135–145)
Total Protein: 7.6 g/dL (ref 6.0–8.3)

## 2018-12-04 LAB — IGA: IGA: 298 mg/dL (ref 68–378)

## 2018-12-04 NOTE — Progress Notes (Signed)
HPI: This is a very pleasant 33 year old man who was referred here by Dr. Maryfrances Bunnell for several months of diarrhea  His wife and 2 young children were here in the room with him today.  Chief complaint is several months of diarrhea  For the past 3 or 4 months he has had loose watery stools about twice a day associated with some mild urgency.  He has had no real abdominal pains throughout this time.  His stools have never been bloody.  He cannot point to any antibiotics in the several months before his change in bowels.  He has however lost about 20 pounds in the past 3 or 4 months.  He tried Imodium and his wife said he broke out into hives.  He has never had troubles like this before, he drinks alcohol rarely.  He does drink a lot of sweet tea.  No family history of inflammatory bowel disease that he is aware of.  No out of country travel.  No sick contacts that he is aware of with diarrheal issues.  Old Data Reviewed:  Blood work December 2019 shows normal complete metabolic profile, normal CBC.   Review of systems: Pertinent positive and negative review of systems were noted in the above HPI section. All other review negative.   Past Medical History:  Diagnosis Date  . Anxiety     History reviewed. No pertinent surgical history.  No current outpatient medications on file.   No current facility-administered medications for this visit.     Allergies as of 12/04/2018 - Review Complete 12/04/2018  Allergen Reaction Noted  . Bee venom Anaphylaxis 05/23/2017  . Codeine Other (See Comments) 05/06/2013  . Penicillins Other (See Comments) 05/06/2013    Family History  Problem Relation Age of Onset  . Cancer Mother   . Cancer Father        skin  . Healthy Sister   . Healthy Brother   . Healthy Son   . Healthy Brother   . Healthy Son     Social History   Socioeconomic History  . Marital status: Single    Spouse name: Not on file  . Number of children: Not on file  .  Years of education: Not on file  . Highest education level: Not on file  Occupational History  . Not on file  Social Needs  . Financial resource strain: Not on file  . Food insecurity:    Worry: Not on file    Inability: Not on file  . Transportation needs:    Medical: Not on file    Non-medical: Not on file  Tobacco Use  . Smoking status: Former Smoker    Packs/day: 1.00    Years: 6.00    Pack years: 6.00    Types: Cigarettes    Last attempt to quit: 10/09/2013    Years since quitting: 5.1  . Smokeless tobacco: Former Neurosurgeon    Types: Chew    Quit date: 05/09/2017  Substance and Sexual Activity  . Alcohol use: Yes    Comment: occ  . Drug use: No  . Sexual activity: Yes    Birth control/protection: None  Lifestyle  . Physical activity:    Days per week: Not on file    Minutes per session: Not on file  . Stress: Not on file  Relationships  . Social connections:    Talks on phone: Not on file    Gets together: Not on file    Attends religious service:  Not on file    Active member of club or organization: Not on file    Attends meetings of clubs or organizations: Not on file    Relationship status: Not on file  . Intimate partner violence:    Fear of current or ex partner: Not on file    Emotionally abused: Not on file    Physically abused: Not on file    Forced sexual activity: Not on file  Other Topics Concern  . Not on file  Social History Narrative  . Not on file     Physical Exam: BP 116/72   Pulse 84   Ht 5\' 7"  (1.702 m)   Wt 162 lb (73.5 kg)   BMI 25.37 kg/m  Constitutional: generally well-appearing Psychiatric: alert and oriented x3 Eyes: extraocular movements intact Mouth: Very poor oral hygiene Neck: supple no lymphadenopathy Cardiovascular: heart regular rate and rhythm Lungs: clear to auscultation bilaterally Abdomen: soft, slight tenderness in the right lower quadrant, nondistended, no obvious ascites, no peritoneal signs, normal bowel  sounds Extremities: no lower extremity edema bilaterally Skin: no lesions on visible extremities   Assessment and plan: 33 y.o. male with 4 months of nonbloody diarrhea, 20 pound weight loss  Unclear etiology but this may be new presentation of inflammatory bowel disease, perhaps an infectious process.  I recommended stool testing, blood work, see those summarized in patient instructions.  If these fail to show a clear explanation for his problems then he will need a colonoscopy.    Please see the "Patient Instructions" section for addition details about the plan.   Rob Bunting, MD [facility] 12/04/2018, 10:35 AM  Cc: Julaine Fusi, NP

## 2018-12-04 NOTE — Patient Instructions (Addendum)
You will have labs checked today in the basement lab.  Please head down after you check out with the front desk  (cbc, cmet, tsh, ttg, total IgA, sed rate, Gi pathogen panel).  You may need further testing (colonoscopy) depending on the above.  Thank you for entrusting me with your care and choosing [facility].  Dr Christella Hartigan

## 2018-12-05 LAB — TSH: TSH: 1.22 u[IU]/mL (ref 0.35–4.50)

## 2018-12-05 LAB — TISSUE TRANSGLUTAMINASE, IGA: (TTG) AB, IGA: 1 U/mL

## 2019-01-21 NOTE — Progress Notes (Signed)
Virtual Visit via Telephone Note  I connected with Richard Chang on 01/22/19 at  8:15 AM EDT by telephone and verified that I am speaking with the correct person using two identifiers.   I discussed the limitations, risks, security and privacy concerns of performing an evaluation and management service by telephone and the availability of in person appointments. The stafff discussed with the patient that there may be a patient responsible charge related to this service. The patient expressed understanding and agreed to proceed.   History of Present Illness: Richard Chang first attempted to use WebEx but could not connect, OV was then converted to telephone encounter. He calls in today with complaint of insect bite that occurred 72 hrs ago while he was cooking outside. He did not see the insect but believes that an ant bit him. 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 He reports sporadically using OTC Benadryl, Claritin, and Aleve- all over 48 hrs ago He reports using ice once He main concern is "that it is still swollen" He denies fever/night sweats/chills/N/V/D/malaise/poor appetite  Patient Care Team    Relationship Specialty Notifications Start End  Julaine Fusi, NP PCP - General Family Medicine  02/04/18     Patient Active Problem List   Diagnosis Date Noted  . Insect bite of left hand 01/22/2019  . Diarrhea 09/24/2018  . Acute maxillary sinusitis 09/24/2018  . Other fatigue 09/24/2018  . Bilateral shoulder pain 05/23/2017  . Bilateral wrist  pain 05/23/2017  . Bilateral hand pain 05/23/2017  . Healthcare maintenance 05/23/2017     Past Medical History:  Diagnosis Date  . Anxiety      History reviewed. No pertinent surgical history.   Family History  Problem Relation Age of Onset  . Cancer Mother   . Cancer Father        skin  . Healthy Sister   . Healthy Brother   . Healthy Son   . Healthy Brother   . Healthy Son      Social History   Substance and Sexual Activity  Drug Use No     Social History   Substance and Sexual Activity  Alcohol Use Yes   Comment: occ     Social History   Tobacco Use  Smoking Status Former Smoker  . Packs/day: 1.00  . Years: 6.00  . Pack years: 6.00  . Types: Cigarettes  . Last attempt to quit: 10/09/2013  . Years since quitting: 5.2  Smokeless Tobacco Former Neurosurgeon  . Types: Chew  . Quit date: 05/09/2017     Outpatient Encounter Medications as of 01/22/2019  Medication Sig  . diphenhydrAMINE (BENADRYL) 25 mg capsule Take 50 mg by mouth every 6 (six) hours as needed.  . loratadine-pseudoephedrine (CLARITIN-D 24-HOUR) 10-240 MG 24 hr tablet Take 1 tablet by mouth daily.   No facility-administered encounter medications on file as of 01/22/2019.     Allergies: Bee venom; Codeine; and Penicillins  There is no height or weight on file to calculate  BMI.  There were no vitals taken for this visit.   Observations/Objective: No acute distress noted over the telephone WebEx did not work, had to convert to telephone call Asked pt to send pictures of insect bite via MyChart, if unable, then to make appt for brief OV so I may visualize site. Pt verbalized understanding and agreement.  Assessment and Plan: Non-sedating OTC Anti-histamine in the morning, OTC Benadryl in the evening. Ice to site 20mins several times daily, do not apply ice directly to skin. OTC NSAIDS with food 1-2 times/day Call clinic if s/s worsen or if infection begins to develop (ie: fever, increase  in redness/warmth, purulent drainage)  Follow Up Instructions: PRN   I discussed the assessment and treatment plan with the patient. The patient was provided an opportunity to ask questions and all were answered. The patient agreed with the plan and demonstrated an understanding of the instructions.   The patient was advised to call back or seek an in-person evaluation if the symptoms worsen or if the condition fails to improve as anticipated.  I provided 15  minutes of non-face-to-face time during this encounter.   Julaine FusiKaty D Neela Zecca, NP

## 2019-01-22 ENCOUNTER — Other Ambulatory Visit: Payer: Self-pay

## 2019-01-22 ENCOUNTER — Encounter: Payer: Self-pay | Admitting: Adult Health

## 2019-01-22 ENCOUNTER — Ambulatory Visit (INDEPENDENT_AMBULATORY_CARE_PROVIDER_SITE_OTHER): Payer: No Typology Code available for payment source | Admitting: Adult Health

## 2019-01-22 DIAGNOSIS — S60562A Insect bite (nonvenomous) of left hand, initial encounter: Secondary | ICD-10-CM | POA: Insufficient documentation

## 2019-01-22 DIAGNOSIS — W57XXXA Bitten or stung by nonvenomous insect and other nonvenomous arthropods, initial encounter: Secondary | ICD-10-CM | POA: Diagnosis not present

## 2019-01-22 NOTE — Assessment & Plan Note (Signed)
Observations/Objective: No acute distress noted over the telephone WebEx did not work, had to convert to telephone call Asked pt to send pictures of insect bite via MyChart, if unable, then to make appt for brief OV so I may visualize site. Pt verbalized understanding and agreement.  Assessment and Plan: Non-sedating OTC Anti-histamine in the morning, OTC Benadryl in the evening. Ice to site several times daily, do not apply ice directly to skin. OTC NSAIDS with food 1-2 times/day Call clinic if s/s worsen or if infection begins to develop (ie: fever, increase in redness/warmth, purulent drainage)  Follow Up Instructions: PRN   I discussed the assessment and treatment plan with the patient. The patient was provided an opportunity to ask questions and all were answered. The patient agreed with the plan and demonstrated an understanding of the instructions.   The patient was advised to call back or seek an in-person evaluation if the symptoms worsen or if the condition fails to improve as anticipated.

## 2019-08-29 ENCOUNTER — Encounter (HOSPITAL_COMMUNITY): Payer: Self-pay

## 2019-08-29 ENCOUNTER — Other Ambulatory Visit: Payer: Self-pay

## 2019-08-29 ENCOUNTER — Telehealth: Payer: Self-pay | Admitting: Adult Health

## 2019-08-29 ENCOUNTER — Emergency Department (HOSPITAL_COMMUNITY): Payer: No Typology Code available for payment source

## 2019-08-29 ENCOUNTER — Emergency Department (HOSPITAL_COMMUNITY)
Admission: EM | Admit: 2019-08-29 | Discharge: 2019-08-29 | Disposition: A | Discharge status: Home / Self Care | Payer: No Typology Code available for payment source | Attending: Emergency Medicine | Admitting: Emergency Medicine

## 2019-08-29 DIAGNOSIS — Z79899 Other long term (current) drug therapy: Secondary | ICD-10-CM | POA: Diagnosis not present

## 2019-08-29 DIAGNOSIS — R05 Cough: Secondary | ICD-10-CM | POA: Diagnosis present

## 2019-08-29 DIAGNOSIS — Z20822 Contact with and (suspected) exposure to covid-19: Secondary | ICD-10-CM

## 2019-08-29 DIAGNOSIS — Z20828 Contact with and (suspected) exposure to other viral communicable diseases: Secondary | ICD-10-CM | POA: Insufficient documentation

## 2019-08-29 MED ORDER — ALBUTEROL SULFATE HFA 108 (90 BASE) MCG/ACT IN AERS
4.0000 | INHALATION_SPRAY | Freq: Once | RESPIRATORY_TRACT | Status: AC
  Administered 2019-08-29: 4 via RESPIRATORY_TRACT
  Filled 2019-08-29: qty 6.7

## 2019-08-29 MED ORDER — KETOROLAC TROMETHAMINE 30 MG/ML IJ SOLN
30.0000 mg | Freq: Once | INTRAMUSCULAR | Status: AC
  Administered 2019-08-29: 30 mg via INTRAMUSCULAR
  Filled 2019-08-29: qty 1

## 2019-08-29 NOTE — ED Notes (Signed)
Patient in room waiting for ride.   Discharge instructions reviewed and patient verbalizes understanding of needing to quarantine.

## 2019-08-29 NOTE — ED Notes (Signed)
Patient ambulated around room at 100% RA.

## 2019-08-29 NOTE — Telephone Encounter (Signed)
Patient's wife called states he has been sick for a week with some kind of respiratory issue.  ---Per wife ( everytime pt coughs he loses his breath.) --Pt has been tested for COVID ( results not back yet ).  --Pt was seeking appt, was advised no available appt & couldn't see provider until after results were back. ( pt's wife wonders if there is something they can do in the mean time while waiting for results).  ---Forwarding message to medical asst to contact pt @ [phone]  --glh

## 2019-08-29 NOTE — Discharge Instructions (Signed)
You have tested positive for COVID-19 virus.  Please continue to quarantine at home and monitor your symptoms closely. You chest x-ray was clear. Antibiotics are not helpful in treating viral infection, the virus should run its course in about 10-14 days. Please make sure you are drinking plenty of fluids. You can treat your symptoms supportively with tylenol for fevers and pains, and over the counter cough syrups and throat lozenges to help with cough.  Use albuterol inhaler as needed.  If your symptoms are not improving please follow up with you Primary doctor.   I recommend that you purchase a home pulse ox to help better monitor your oxygen at home, if you start to have increased work of breathing or shortness of breath or your oxygen drops below 90% please immediately return to the hospital for reevaluation.  If you develop persistent fevers, shortness of breath or difficulty breathing, chest pain, severe headache and neck pain, persistent nausea and vomiting or other new or concerning symptoms return to the Emergency department.

## 2019-08-29 NOTE — Telephone Encounter (Signed)
When I called patient he was very short of breath and sounded like he was wheezing. He was talking to me on the phone and was alert but seemed to be struggling to breath. Patient stated that he had COVID test at [facility] in [place] on Wednesday but didn't have the results. I advised patient since I could audibly hear him having issues breathing that I advise he call 911 and tell them he is SOB and has a pending COVID test and request to be taken to [facility] ED. Patient verbalized understanding and said his wife was there with him and that they would call 911. AS, CMA

## 2019-08-29 NOTE — ED Triage Notes (Signed)
Pt states that he has had COVID like symptoms x 1 week: runny rose, cough. Pt denies N/V/D; still has taste and smell.

## 2019-08-29 NOTE — ED Provider Notes (Signed)
[facility] Note   CSN: [id number] Arrival date & time: 08/29/19  1137     History   Chief Complaint Chief Complaint  Patient presents with   Cough    HPI Richard Chang is a 33 y.o. male.     Richard Chang is a 33 y.o. male with history of anxiety, who presents to the ED for evaluation of 1 week of chills, rhinorrhea, cough, over the past 1 to 2 days he has developed some shortness of breath and chest pain.  Patient reports that he had a Covid test done outpatient on Wednesday but has not yet received results.  Also reports that multiple of his coworkers at work have already tested positive for [employer].  Describes chest pain as a dull ache over the breastbone that comes and goes.  It is not worse with exertion and is not pleuritic in nature.  It is nonradiating.  He reports shortness of breath is intermittent, both chest pain and shortness of breath seem to be made worse with coughing.  Patient took Tylenol intermittently and has been using some cough syrup without much improvement has not tried anything else to treat his symptoms.  Reports he was a previous smoker but stopped this.  No other aggravating or alleviating factors.     Past Medical History:  Diagnosis Date   Anxiety     Patient Active Problem List   Diagnosis Date Noted   Insect bite of left hand 01/22/2019   Diarrhea 09/24/2018   Acute maxillary sinusitis 09/24/2018   Other fatigue 09/24/2018   Bilateral shoulder pain 05/23/2017   Bilateral wrist pain 05/23/2017   Bilateral hand pain 05/23/2017   Healthcare maintenance 05/23/2017    History reviewed. No pertinent surgical history.      Home Medications    Prior to Admission medications   Medication Sig Start Date End Date Taking? Authorizing Provider  diphenhydrAMINE (BENADRYL) 25 mg capsule Take 50 mg by mouth every 6 (six) hours as needed.    [provider]    loratadine-pseudoephedrine (CLARITIN-D 24-HOUR) 10-240 MG 24 hr tablet Take 1 tablet by mouth daily.    [provider]    Family History Family History  Problem Relation Age of Onset   Cancer Mother    Cancer Father        skin   Healthy Sister    Healthy Brother    Healthy Son    Healthy Brother    Healthy Son     Social History Social History   Tobacco Use   Smoking status: Former Smoker    Packs/day: 1.00    Years: 6.00    Pack years: 6.00    Types: Cigarettes    Quit date: 10/09/2013    Years since quitting: 5.8   Smokeless tobacco: Former Neurosurgeon    Types: Chew    Quit date: 05/09/2017  Substance Use Topics   Alcohol use: Yes    Comment: occ   Drug use: No     Allergies   Bee venom, Codeine, and Penicillins   Review of Systems Review of Systems  Constitutional: Positive for chills. Negative for fever.  HENT: Positive for congestion, rhinorrhea and sore throat.   Respiratory: Positive for cough and shortness of breath.   Cardiovascular: Positive for chest pain. Negative for palpitations and leg swelling.  Gastrointestinal: Negative for abdominal pain, diarrhea, nausea and vomiting.  Genitourinary: Negative for dysuria and frequency.  Musculoskeletal: Positive for  myalgias. Negative for arthralgias.  Skin: Negative for color change and rash.  Neurological: Positive for headaches. Negative for dizziness, syncope and light-headedness.     Physical Exam Updated Vital Signs BP (!) 143/76    Pulse 64    Temp 98.8 F (37.1 C) (Oral)    Resp 16    Wt 79.4 kg    SpO2 99%    BMI 27.41 kg/m   Physical Exam Vitals signs and nursing note reviewed.  Constitutional:      General: He is not in acute distress.    Appearance: Normal appearance. He is well-developed and normal weight. He is not ill-appearing or diaphoretic.  HENT:     Head: Normocephalic and atraumatic.     Nose: Congestion and rhinorrhea present.     Mouth/Throat:     Mouth:  Mucous membranes are moist.     Pharynx: Oropharynx is clear.     Comments: Posterior oropharynx clear and mucous membranes moist, there is mild erythema but no edema or tonsillar exudates, uvula midline, normal phonation, no trismus, tolerating secretions without difficulty. Eyes:     General:        Right eye: No discharge.        Left eye: No discharge.  Neck:     Musculoskeletal: Neck supple.     Comments: No rigidity Cardiovascular:     Rate and Rhythm: Normal rate and regular rhythm.     Heart sounds: Normal heart sounds.  Pulmonary:     Effort: Pulmonary effort is normal. No respiratory distress.     Breath sounds: Normal breath sounds.     Comments: Respirations equal and unlabored, patient able to speak in full sentences, lungs clear to auscultation bilaterally Abdominal:     General: Bowel sounds are normal. There is no distension.     Palpations: Abdomen is soft. There is no mass.     Tenderness: There is no abdominal tenderness. There is no guarding.     Comments: Abdomen soft, nondistended, nontender to palpation in all quadrants without guarding or peritoneal signs  Musculoskeletal:        General: No deformity.  Lymphadenopathy:     Cervical: No cervical adenopathy.  Skin:    General: Skin is warm and dry.     Capillary Refill: Capillary refill takes less than 2 seconds.  Neurological:     Mental Status: He is alert and oriented to person, place, and time.  Psychiatric:        Mood and Affect: Mood normal.        Behavior: Behavior normal.      ED Treatments / Results  Labs (all labs ordered are listed, but only abnormal results are displayed) Labs Reviewed - No data to display  EKG EKG Interpretation  Date/Time:  Friday August 29 2019 12:41:11 EST Ventricular Rate:  59 PR Interval:    QRS Duration: 85 QT Interval:  383 QTC Calculation: 380 R Axis:   0 Text Interpretation: Sinus rhythm Low voltage, extremity leads no acute ST/T changes No old  tracing to compare Confirmed by Pricilla LovelessGoldston, Scott [phone]([id number]) on 08/29/2019 12:45:06 PM   Radiology Dg Chest Port 1 View  Result Date: 08/29/2019 CLINICAL DATA:  Cough and chest pain EXAM: PORTABLE CHEST 1 VIEW COMPARISON:  May 15, 2010 FINDINGS: Lungs are clear. Heart size and pulmonary vascularity are normal. No adenopathy. No pneumothorax. No bone lesions. IMPRESSION: No abnormality noted. Electronically Signed   By: Bretta BangWilliam  Woodruff III M.D.  On: 08/29/2019 13:05    Procedures Procedures (including critical care time)  Medications Ordered in ED Medications  albuterol (VENTOLIN HFA) 108 (90 Base) MCG/ACT inhaler 4 puff (4 puffs Inhalation Given 08/29/19 1310)  ketorolac (TORADOL) 30 MG/ML injection 30 mg (30 mg Intramuscular Given 08/29/19 1309)     Initial Impression / Assessment and Plan / ED Course  I have reviewed the triage vital signs and the nursing notes.  Pertinent labs & imaging results that were available during my care of the patient were reviewed by me and considered in my medical decision making (see chart for details).  Patient with 1 week of chills, rhinorrhea, cough, over the past 2 to 3 days he started to experience some intermittent shortness of breath and some mild central chest discomfort he reports as a dull ache.  On arrival he has normal vitals.  Patient reports that on Wednesday he was tested for Covid but has not yet received his results.  He reports that multiple of his coworkers recently tested positive for Covid.  Based on patient's symptoms I think he very likely has coronavirus infection.  He ambulated in the room and maintained 100% O2 sats and vitals are very reassuring.  Chest pain is reproducible on exam and I suspect it is some degree of costochondritis from recurrent cough and viral infection.  He has no hypotension and EKG is nonischemic has no signs of pericarditis.  Patient treated symptomatically here in the ED with improvement.  Patient provided  with albuterol inhaler and encouraged to continue using Tylenol and over-the-counter cough medications to treat his symptoms.  Encourage patient to purchase a pulse oximeter to help monitor symptoms at home.  Strict return precautions discussed.  Discharged home in good condition.  Richard Chang was evaluated in Emergency Department on 08/29/2019 for the symptoms described in the history of present illness. He was evaluated in the context of the global COVID-19 pandemic, which necessitated consideration that the patient might be at risk for infection with the SARS-CoV-2 virus that causes COVID-19. Institutional protocols and algorithms that pertain to the evaluation of patients at risk for COVID-19 are in a state of rapid change based on information released by regulatory bodies including the CDC and federal and state organizations. These policies and algorithms were followed during the patient's care in the ED.   Final Clinical Impressions(s) / ED Diagnoses   Final diagnoses:  Suspected COVID-19 virus infection    ED Discharge Orders    None       Jacqlyn Larsen, [place] 08/29/19 1445    Sherwood Gambler, MD 08/29/19 1455

## 2019-09-02 ENCOUNTER — Telehealth: Payer: Self-pay | Admitting: Adult Health

## 2019-09-02 NOTE — Telephone Encounter (Signed)
Pt states that he has been using his inhaler intermittently only.  He denies fever, but continues to have a cough with some sputum production.  Please advise.  Charyl Bigger, CMA

## 2019-09-02 NOTE — Telephone Encounter (Signed)
Per wife, Patient is still experiencing SOB when doing daily activities--- Rapid COVID test done on Wednesday @ [facility] was  (Neg)   ---Patient even went to [facility] ED on Friday 11/20 for breathing issue.  --Forwarding message to med asst to call pt @ [phone] with advice?  --glh

## 2019-09-02 NOTE — Telephone Encounter (Signed)
LVM informing pt of inhaler and requesting that he call our office back to discuss additional measures.  Charyl Bigger, CMA

## 2019-09-02 NOTE — Telephone Encounter (Signed)
Will send in Albuterol inhaler- do not see on listed on his med list. Is he using tobacco/vape products- if so, please advise him to avoid all. Discuss Red Flag sx's and when to seek immediate medical care. If he is not improved in a day or so- please have him schedule appt- TeleMedicine Thanks! Valetta Fuller

## 2019-09-03 NOTE — Telephone Encounter (Signed)
Pt informed of additional recommendations, red flag symptoms, etc.  Pt expressed understanding and is agreeable.  Charyl Bigger, CMA

## 2020-03-06 IMAGING — DX DG CHEST 1V PORT
1 series · 1 of 1 positions shown · non-contrast
Comparison: May 15, 2010

CLINICAL DATA: Cough and chest pain

EXAM:
PORTABLE CHEST 1 VIEW

[chest ap]
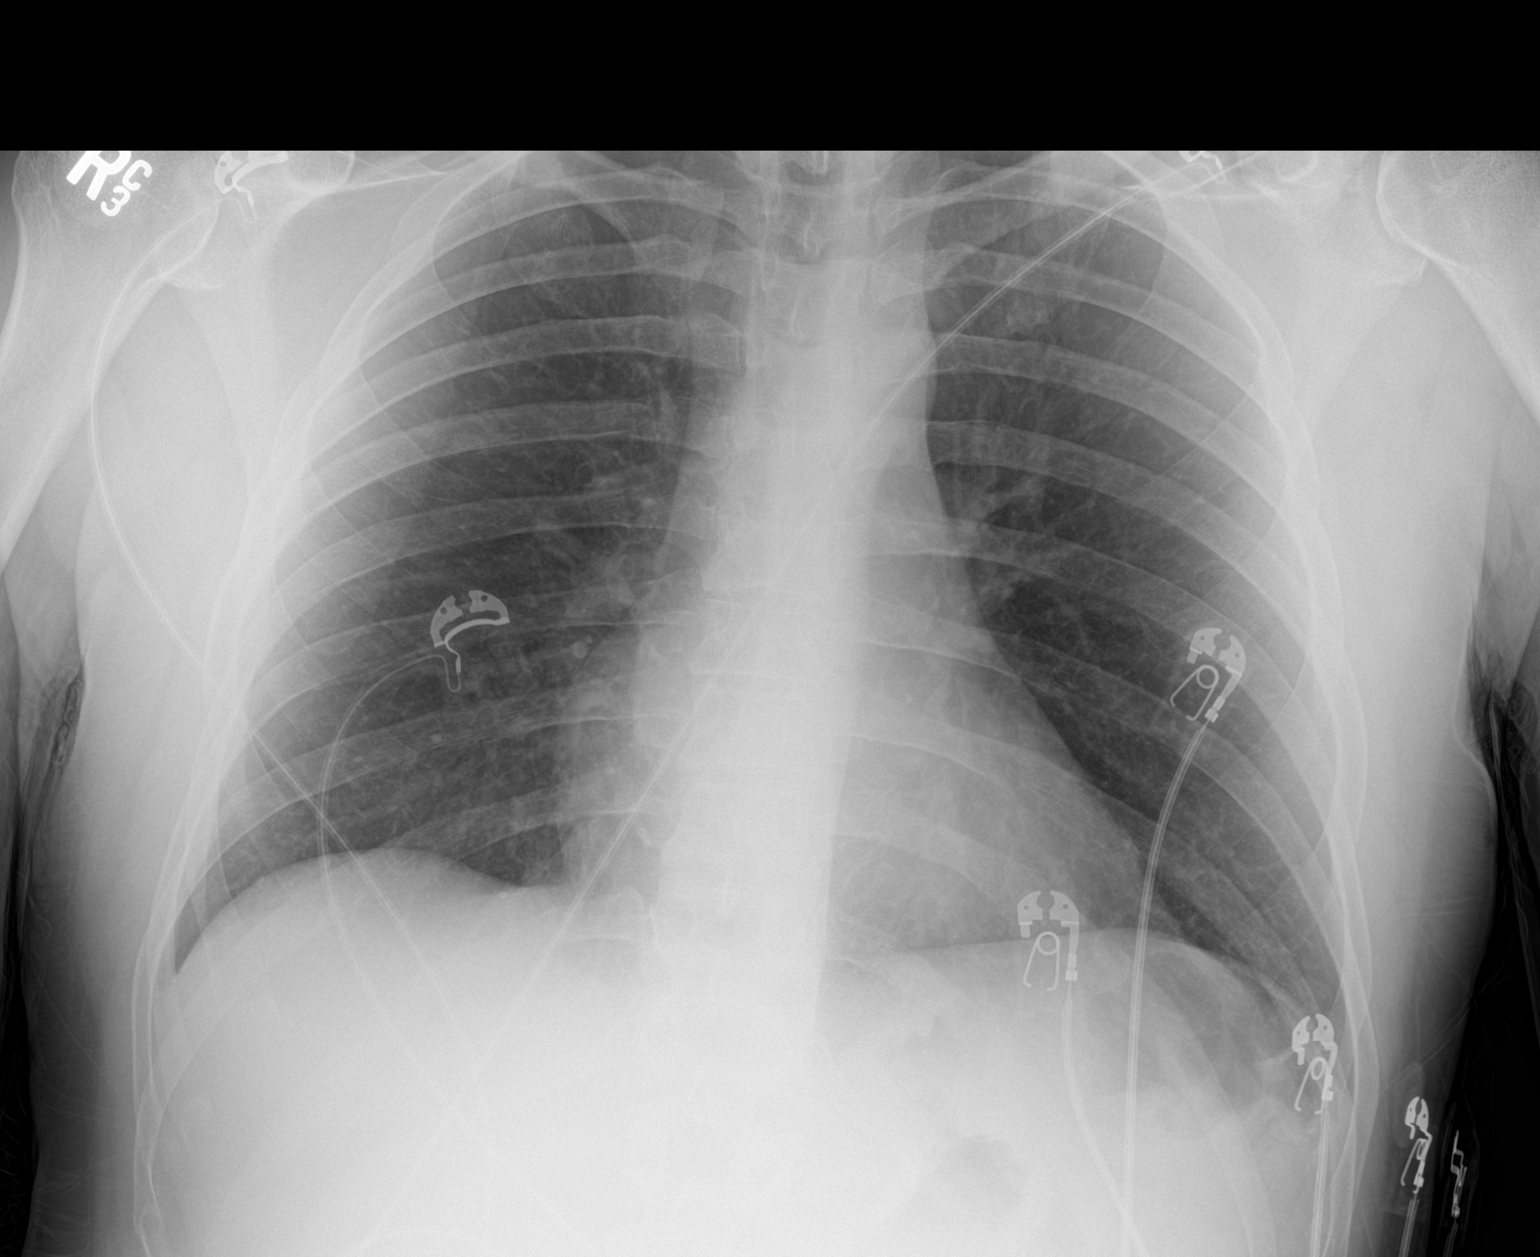

[1 of 1 positions shown; findings below may reference images not displayed]

FINDINGS: Lungs are clear. Heart size and pulmonary vascularity are normal. No
adenopathy. No pneumothorax. No bone lesions.
IMPRESSION: No abnormality noted.

## 2020-06-28 ENCOUNTER — Ambulatory Visit
Admission: EM | Admit: 2020-06-28 | Discharge: 2020-06-28 | Disposition: A | Discharge status: Home / Self Care | Payer: No Typology Code available for payment source | Attending: Urgent Care | Admitting: Urgent Care

## 2020-06-28 ENCOUNTER — Encounter: Payer: Self-pay | Admitting: *Deleted

## 2020-06-28 ENCOUNTER — Other Ambulatory Visit: Payer: Self-pay

## 2020-06-28 DIAGNOSIS — L237 Allergic contact dermatitis due to plants, except food: Secondary | ICD-10-CM

## 2020-06-28 DIAGNOSIS — L299 Pruritus, unspecified: Secondary | ICD-10-CM

## 2020-06-28 MED ORDER — HYDROXYZINE HCL 25 MG PO TABS: 12.5000 mg | ORAL_TABLET | Freq: Three times a day (TID) | ORAL | 0 refills | Status: DC | PRN

## 2020-06-28 MED ORDER — PREDNISONE 20 MG PO TABS: ORAL_TABLET | ORAL | 0 refills | Status: DC

## 2020-06-28 NOTE — ED Provider Notes (Signed)
[facility]   MRN: [id number] DOB: [birth year]  Subjective:   Richard Chang is a 34 y.o. male presenting for 4-day history of acute onset pruritic rash over his arms, torso spreading to his neck and face.  Patient has known allergic reaction to poison ivy, came into contact with it last week.  Has been using topical calamine lotion without relief.  Denies oral facial swelling, shortness of breath, wheezing, abdominal pain.  No current facility-administered medications for this encounter.  Current Outpatient Medications:    diphenhydrAMINE (BENADRYL) 25 mg capsule, Take 50 mg by mouth every 6 (six) hours as needed., Disp: , Rfl:    loratadine-pseudoephedrine (CLARITIN-D 24-HOUR) 10-240 MG 24 hr tablet, Take 1 tablet by mouth daily., Disp: , Rfl:    Allergies  Allergen Reactions   Bee Venom Anaphylaxis   Codeine Other (See Comments)    Unknown   Penicillins Other (See Comments)    Has patient had a PCN reaction causing immediate rash, facial/tongue/throat swelling, SOB or lightheadedness with hypotension: Unknown Has patient had a PCN reaction causing severe rash involving mucus membranes or skin necrosis: Unknown Has patient had a PCN reaction that required hospitalization: Unknown Has patient had a PCN reaction occurring within the last 10 years: No If all of the above answers are "NO", then may proceed with Cephalosporin use.     Past Medical History:  Diagnosis Date   Anxiety      History reviewed. No pertinent surgical history.  Family History  Problem Relation Age of Onset   Cancer Mother    Cancer Father        skin   Healthy Sister    Healthy Brother    Healthy Son    Healthy Brother    Healthy Son     Social History   Tobacco Use   Smoking status: Former Smoker    Packs/day: 1.00    Years: 6.00    Pack years: 6.00    Types: Cigarettes    Quit date: 10/09/2013    Years since quitting: 6.7   Smokeless tobacco: Former Neurosurgeon      Types: Chew    Quit date: 05/09/2017  Vaping Use   Vaping Use: Never used  Substance Use Topics   Alcohol use: Yes    Comment: occ   Drug use: No    ROS   Objective:   Vitals: BP 123/86 (BP Location: Left Arm)    Pulse (!) 52    Temp 98 F (36.7 C) (Oral)    Resp 16    SpO2 98%   Physical Exam Constitutional:      General: He is not in acute distress.    Appearance: Normal appearance. He is well-developed. He is not ill-appearing, toxic-appearing or diaphoretic.  HENT:     Head: Normocephalic and atraumatic.     Right Ear: External ear normal.     Left Ear: External ear normal.     Nose: Nose normal.     Mouth/Throat:     Mouth: Mucous membranes are moist.     Pharynx: Oropharynx is clear.  Eyes:     General: No scleral icterus.    Extraocular Movements: Extraocular movements intact.     Pupils: Pupils are equal, round, and reactive to light.  Cardiovascular:     Rate and Rhythm: Normal rate and regular rhythm.     Heart sounds: Normal heart sounds. No murmur heard.  No friction rub. No gallop.   Pulmonary:  Effort: Pulmonary effort is normal. No respiratory distress.     Breath sounds: Normal breath sounds. No stridor. No wheezing, rhonchi or rales.  Skin:    General: Skin is warm and dry.     Findings: Rash (Diffusely scattered erythematous rash over arms, torso and neck with multiple excoriations over his arms) present.  Neurological:     Mental Status: He is alert and oriented to person, place, and time.  Psychiatric:        Mood and Affect: Mood normal.        Behavior: Behavior normal.        Thought Content: Thought content normal.       Assessment and Plan :   PDMP not reviewed this encounter.  1. Poison ivy dermatitis   2. Itching     Will use a 15-day steroid course to address contact dermatitis.  Use Vistaril for severe itching. Counseled patient on potential for adverse effects with medications prescribed/recommended today, ER and  return-to-clinic precautions discussed, patient verbalized understanding.    Wallis Bamberg, PA-C 06/28/20 1030

## 2020-06-28 NOTE — ED Triage Notes (Signed)
Patient has rash to bilateral arms and chest since Thursday. Exposure to poison ivy at work. Patient states he has been taking poison ivy anti itch cream but has not experienced relief.

## 2020-12-20 ENCOUNTER — Ambulatory Visit
Admission: EM | Admit: 2020-12-20 | Discharge: 2020-12-20 | Disposition: A | Discharge status: Home / Self Care | Payer: No Typology Code available for payment source | Attending: Emergency Medicine | Admitting: Emergency Medicine

## 2020-12-20 ENCOUNTER — Other Ambulatory Visit: Payer: Self-pay

## 2020-12-20 ENCOUNTER — Encounter: Payer: Self-pay | Admitting: Emergency Medicine

## 2020-12-20 DIAGNOSIS — H209 Unspecified iridocyclitis: Secondary | ICD-10-CM

## 2020-12-20 MED ORDER — NEOMYCIN-POLYMYXIN-DEXAMETH 3.5-10000-0.1 OP SUSP: 2.0000 [drp] | Freq: Four times a day (QID) | OPHTHALMIC | 0 refills | Status: DC

## 2020-12-20 NOTE — Discharge Instructions (Signed)
Use maxitrol 4 times daily Follow up with ophthalmology

## 2020-12-20 NOTE — ED Triage Notes (Signed)
Last night was playing with nerf guns.  Patient was shot in the right eye.  Patient reports nerf gun was less than 12 inches from face.  Reports vision is "hazy".  Left eye is painful.  Patient does not wear glasses or contacts

## 2020-12-20 NOTE — ED Provider Notes (Signed)
[facility]    CSN: [id number] Arrival date & time: 12/20/20  0916      History   Chief Complaint Chief Complaint  Patient presents with  . Eye Problem    HPI Richard Chang is a 35 y.o. male presenting today for right eye injury.  Reports yesterday injured right eye with Nerf gun.  Since has developed pain, watery drainage, light sensitivity and slightly blurry vision.  Denies contact use or glasses.  Denies history of any eye problems.  HPI  Past Medical History:  Diagnosis Date  . Anxiety     Patient Active Problem List   Diagnosis Date Noted  . Insect bite of left hand 01/22/2019  . Diarrhea 09/24/2018  . Acute maxillary sinusitis 09/24/2018  . Other fatigue 09/24/2018  . Bilateral shoulder pain 05/23/2017  . Bilateral wrist pain 05/23/2017  . Bilateral hand pain 05/23/2017  . Healthcare maintenance 05/23/2017    History reviewed. No pertinent surgical history.     Home Medications    Prior to Admission medications   Medication Sig Start Date End Date Taking? Authorizing Provider  neomycin-polymyxin b-dexamethasone (MAXITROL) 3.5-10000-0.1 SUSP Place 2 drops into the right eye every 6 (six) hours. 12/20/20  Yes Alyric Parkin, Junius Creamer, PA-C    Family History Family History  Problem Relation Age of Onset  . Cancer Mother   . Cancer Father        skin  . Healthy Sister   . Healthy Brother   . Healthy Son   . Healthy Brother   . Healthy Son     Social History Social History   Tobacco Use  . Smoking status: Former Smoker    Packs/day: 1.00    Years: 6.00    Pack years: 6.00    Types: Cigarettes    Quit date: 10/09/2013    Years since quitting: 7.2  . Smokeless tobacco: Former Neurosurgeon    Types: Chew    Quit date: 05/09/2017  Vaping Use  . Vaping Use: Never used  Substance Use Topics  . Alcohol use: Yes    Comment: occ  . Drug use: No     Allergies   Bee venom, Codeine, and Penicillins   Review of Systems Review of Systems   Constitutional: Negative for fatigue and fever.  Eyes: Positive for photophobia, pain, redness and visual disturbance. Negative for itching.  Respiratory: Negative for shortness of breath.   Cardiovascular: Negative for chest pain and leg swelling.  Gastrointestinal: Negative for nausea and vomiting.  Musculoskeletal: Negative for arthralgias and myalgias.  Skin: Negative for color change, rash and wound.  Neurological: Negative for dizziness, syncope, weakness, light-headedness and headaches.     Physical Exam Triage Vital Signs ED Triage Vitals  Enc Vitals Group     BP      Pulse      Resp      Temp      Temp src      SpO2      Weight      Height      Head Circumference      Peak Flow      Pain Score      Pain Loc      Pain Edu?      Excl. in GC?    No data found.  Updated Vital Signs BP 110/65 (BP Location: Right Arm)   Pulse 71   Temp 98.1 F (36.7 C) (Oral)   Resp 18   SpO2  98%   Visual Acuity Right Eye Distance:   Left Eye Distance:   Bilateral Distance:    Right Eye Near:   Left Eye Near:    Bilateral Near:     Physical Exam Vitals and nursing note reviewed.  Constitutional:      Appearance: He is well-developed.     Comments: No acute distress  HENT:     Head: Normocephalic and atraumatic.     Nose: Nose normal.  Eyes:     Conjunctiva/sclera: Conjunctivae normal.     Comments: Right eye without any periorbital swelling or erythema, no significant conjunctival erythema with mild limbal erythema, consensual photophobia present, no corneal abrasion noted with fluorescein staining, possible small area of conjunctival uptake noted and right lower quadrant  Cardiovascular:     Rate and Rhythm: Normal rate.  Pulmonary:     Effort: Pulmonary effort is normal. No respiratory distress.  Abdominal:     General: There is no distension.  Musculoskeletal:        General: Normal range of motion.     Cervical back: Neck supple.  Skin:    General: Skin  is warm and dry.  Neurological:     Mental Status: He is alert and oriented to person, place, and time.      UC Treatments / Results  Labs (all labs ordered are listed, but only abnormal results are displayed) Labs Reviewed - No data to display  EKG   Radiology No results found.  Procedures Procedures (including critical care time)  Medications Ordered in UC Medications - No data to display  Initial Impression / Assessment and Plan / UC Course  I have reviewed the triage vital signs and the nursing notes.  Pertinent labs & imaging results that were available during my care of the patient were reviewed by me and considered in my medical decision making (see chart for details).     Exam and history most consistent with traumatic iritis-placing on Maxitrol, but stressed importance of ophthalmology follow-up in the next 1 to 2 days, contact provided.  Discussed strict return precautions. Patient verbalized understanding and is agreeable with plan.  Final Clinical Impressions(s) / UC Diagnoses   Final diagnoses:  Traumatic iritis     Discharge Instructions     Use maxitrol 4 times daily Follow up with ophthalmology      ED Prescriptions    Medication Sig Dispense Auth. Provider   neomycin-polymyxin b-dexamethasone (MAXITROL) 3.5-10000-0.1 SUSP Place 2 drops into the right eye every 6 (six) hours. 5 mL Glendene Wyer, [place] C, PA-C     PDMP not reviewed this encounter.   Sharyon Cable [place] C, PA-C 12/20/20 1052

## 2021-03-01 ENCOUNTER — Ambulatory Visit: Payer: No Typology Code available for payment source | Admitting: Nurse Practitioner

## 2021-03-04 ENCOUNTER — Encounter: Payer: Self-pay | Admitting: Nurse Practitioner

## 2021-03-04 ENCOUNTER — Other Ambulatory Visit: Payer: Self-pay

## 2021-03-04 ENCOUNTER — Ambulatory Visit: Payer: No Typology Code available for payment source | Admitting: Nurse Practitioner

## 2021-03-04 VITALS — BP 96/68 | HR 68 | Temp 97.7°F | Ht 67.0 in | Wt 169.5 lb

## 2021-03-04 DIAGNOSIS — F321 Major depressive disorder, single episode, moderate: Secondary | ICD-10-CM

## 2021-03-04 DIAGNOSIS — R5383 Other fatigue: Secondary | ICD-10-CM

## 2021-03-04 DIAGNOSIS — Z0000 Encounter for general adult medical examination without abnormal findings: Secondary | ICD-10-CM

## 2021-03-04 MED ORDER — BUPROPION HCL ER (SR) 100 MG PO TB12: 100.0000 mg | ORAL_TABLET | Freq: Every day | ORAL | 1 refills | Status: DC

## 2021-03-04 NOTE — Progress Notes (Signed)
Established Patient Office Visit  Subjective:  Patient ID: Richard Chang, male    DOB: 1986-01-09  Age: 35 y.o. MRN: [id number]  CC:  Chief Complaint  Patient presents with  . Depression    HPI Richard Chang presents for evaluation of possible depression. States that he feels very unmotivated. States that this has been off and on for years. States that, now, his wife has some major health issues going on and he needs to have some help managing his mood and motivation. States that his wife will have surgery in June to correct Chiari malformation. It has been a two year long journey getting to this diagnosis. He states that he will be staying home with her and their four children while she recovers and needs to be headed in the right direction before then. The patient states that he has four children. They are 14, 10, 75, and 50 years of age. He states that his ten year old is autistic and completely non verbal at this time.  He states that when he gets up in the morning, he just doesn't want to get out of bed. Doesn't want to do anything when he gets to work. States that he is frequently irritable. He is easy to anger. He states that he does not go out or interact with friends and family members if that means leaving the house. He denies feeling suicidal. He does not want to hurt himself or anyone else. States that he has family history of bipolar depression. Both his father and his brother have been hospitalized for this in the recent past.  The patient denies other concerns or complaints at this time. He denies chest pain, chest pressure, or shortness of breath. He denies headaches or visual disturbances. He denies abdominal pain, nausea, vomiting, or changes in bowel or bladder habits.   Past Medical History:  Diagnosis Date  . Anxiety     History reviewed. No pertinent surgical history.  Family History  Problem Relation Age of Onset  . Cancer Mother   . Cancer Father        skin   . Healthy Sister   . Healthy Brother   . Healthy Son   . Healthy Brother   . Healthy Son     Social History   Socioeconomic History  . Marital status: Married    Spouse name: Not on file  . Number of children: Not on file  . Years of education: Not on file  . Highest education level: Not on file  Occupational History  . Not on file  Tobacco Use  . Smoking status: Former Smoker    Packs/day: 1.00    Years: 6.00    Pack years: 6.00    Types: Cigarettes    Quit date: 10/09/2013    Years since quitting: 7.4  . Smokeless tobacco: Former Neurosurgeon    Types: Chew    Quit date: 05/09/2017  Vaping Use  . Vaping Use: Never used  Substance and Sexual Activity  . Alcohol use: Yes    Comment: occ  . Drug use: No  . Sexual activity: Yes    Birth control/protection: None  Other Topics Concern  . Not on file  Social History Narrative  . Not on file   Social Determinants of Health   Financial Resource Strain: Not on file  Food Insecurity: Not on file  Transportation Needs: Not on file  Physical Activity: Not on file  Stress: Not on file  Social Connections: Not on file  Intimate Partner Violence: Not on file    Outpatient Medications Prior to Visit  Medication Sig Dispense Refill  . neomycin-polymyxin b-dexamethasone (MAXITROL) 3.5-10000-0.1 SUSP Place 2 drops into the right eye every 6 (six) hours. (Patient not taking: Reported on 03/04/2021) 5 mL 0   No facility-administered medications prior to visit.    Allergies  Allergen Reactions  . Bee Venom Anaphylaxis  . Codeine Other (See Comments)    Unknown  . Penicillins Other (See Comments)    Has patient had a PCN reaction causing immediate rash, facial/tongue/throat swelling, SOB or lightheadedness with hypotension: Unknown Has patient had a PCN reaction causing severe rash involving mucus membranes or skin necrosis: Unknown Has patient had a PCN reaction that required hospitalization: Unknown Has patient had a PCN reaction  occurring within the last 10 years: No If all of the above answers are "NO", then may proceed with Cephalosporin use.     ROS Review of Systems  Constitutional: Positive for activity change and fatigue. Negative for chills and fever.       Does not participate in activities he would normally find enjoyable.   HENT: Negative.  Negative for congestion and sinus pain.   Eyes: Negative.   Respiratory: Negative for cough, chest tightness, shortness of breath and wheezing.   Cardiovascular: Negative for chest pain and palpitations.  Gastrointestinal: Negative for constipation, diarrhea, nausea and vomiting.  Endocrine: Negative for cold intolerance, heat intolerance, polydipsia and polyuria.  Musculoskeletal: Negative for back pain and myalgias.  Skin: Negative for rash.  Allergic/Immunologic: Negative.   Neurological: Negative for dizziness, weakness and headaches.  Psychiatric/Behavioral: Positive for dysphoric mood. Negative for suicidal ideas. The patient is nervous/anxious.   All other systems reviewed and are negative.     Objective:    Physical Exam Vitals and nursing note reviewed.  Constitutional:      Appearance: Normal appearance. He is well-developed.  HENT:     Head: Normocephalic and atraumatic.     Mouth/Throat:     Mouth: Mucous membranes are moist.     Pharynx: Oropharynx is clear.  Eyes:     Extraocular Movements: Extraocular movements intact.     Conjunctiva/sclera: Conjunctivae normal.     Pupils: Pupils are equal, round, and reactive to light.  Cardiovascular:     Rate and Rhythm: Normal rate and regular rhythm.     Pulses: Normal pulses.     Heart sounds: Normal heart sounds.  Pulmonary:     Effort: Pulmonary effort is normal.     Breath sounds: Normal breath sounds.  Abdominal:     Palpations: Abdomen is soft.  Musculoskeletal:        General: Normal range of motion.     Cervical back: Normal range of motion and neck supple.  Skin:    General: Skin  is warm and dry.     Capillary Refill: Capillary refill takes less than 2 seconds.  Neurological:     General: No focal deficit present.     Mental Status: He is alert and oriented to person, place, and time.  Psychiatric:        Attention and Perception: Attention and perception normal.        Mood and Affect: Affect normal. Mood is depressed.        Speech: Speech normal.        Behavior: Behavior normal. Behavior is cooperative.        Thought Content: Thought content normal.  Cognition and Memory: Cognition and memory normal.        Judgment: Judgment normal.     Today's Vitals   03/04/21 0928  BP: 96/68  Pulse: 68  Temp: 97.7 F (36.5 C)  SpO2: 100%  Weight: 169 lb 8 oz (76.9 kg)  Height: 5\' 7"  (1.702 m)   Body mass index is 26.55 kg/m.   Wt Readings from Last 3 Encounters:  03/04/21 169 lb 8 oz (76.9 kg)  08/29/19 175 lb (79.4 kg)  12/04/18 162 lb (73.5 kg)     Health Maintenance Due  Topic Date Due  . COVID-19 Vaccine (1) Never done  . HIV Screening  Never done  . Hepatitis C Screening  Never done    There are no preventive care reminders to display for this patient.  Lab Results  Component Value Date   TSH 1.22 12/04/2018   Lab Results  Component Value Date   WBC 6.1 12/04/2018   HGB 15.1 12/04/2018   HCT 43.3 12/04/2018   MCV 84.5 12/04/2018   PLT 215.0 12/04/2018   Lab Results  Component Value Date   NA 141 12/04/2018   K 4.4 12/04/2018   CO2 31 12/04/2018   GLUCOSE 77 12/04/2018   BUN 12 12/04/2018   CREATININE 0.96 12/04/2018   BILITOT 0.6 12/04/2018   ALKPHOS 71 12/04/2018   AST 13 12/04/2018   ALT 12 12/04/2018   PROT 7.6 12/04/2018   ALBUMIN 4.6 12/04/2018   CALCIUM 9.7 12/04/2018   GFR 90.99 12/04/2018   Lab Results  Component Value Date   CHOL 127 05/28/2017   Lab Results  Component Value Date   HDL 36 (L) 05/28/2017   Lab Results  Component Value Date   LDLCALC 72 05/28/2017   Lab Results  Component Value  Date   TRIG 96 05/28/2017   Lab Results  Component Value Date   CHOLHDL 3.5 05/28/2017   Lab Results  Component Value Date   HGBA1C 5.0 05/28/2017      Assessment & Plan:  1. Other fatigue Fatigue likely related to depression. Will check labs including TSH and HgbA1c for further evaluation.  - CBC with Differential/Platelet - Comprehensive metabolic panel - TSH - Hemoglobin A1c  2. Current moderate episode of major depressive disorder without prior episode (HCC) Add buproion SR 100mg  daily. Offered referral to counseling services as well. Patient declines this for now, but will reevaluate this need at next visit.  - buPROPion (WELLBUTRIN SR) 100 MG 12 hr tablet; Take 1 tablet (100 mg total) by mouth daily.  Dispense: 30 tablet; Refill: 1  3. Healthcare maintenance Check routine, fasting labs today.  - CBC with Differential/Platelet - Comprehensive metabolic panel - Lipid panel - TSH - Hemoglobin A1c  Problem List Items Addressed This Visit      Other   Healthcare maintenance   Relevant Orders   CBC with Differential/Platelet   Comprehensive metabolic panel   Lipid panel   TSH   Hemoglobin A1c   Other fatigue - Primary   Relevant Orders   CBC with Differential/Platelet   Comprehensive metabolic panel   TSH   Hemoglobin A1c   Current moderate episode of major depressive disorder without prior episode (HCC)   Relevant Medications   buPROPion (WELLBUTRIN SR) 100 MG 12 hr tablet      Meds ordered this encounter  Medications  . buPROPion (WELLBUTRIN SR) 100 MG 12 hr tablet    Sig: Take 1 tablet (100 mg total) by  mouth daily.    Dispense:  30 tablet    Refill:  1    Order Specific Question:   Supervising Provider    Answer:   Nani Gasser D [[id number]]    Follow-up: Return in about 2 weeks (around 03/18/2021) for mood - started wellbutrin.    Carlean Jews, NP

## 2021-03-04 NOTE — Patient Instructions (Signed)
Major Depressive Disorder, Adult Major depressive disorder is a mental health condition. This disorder affects feelings. It can also affect the body. Symptoms of this condition last most of the day, almost every day, for 2 weeks. This disorder can affect:  Relationships.  Daily activities, such as work and school.  Activities that you normally like to do. What are the causes? The cause of this condition is not known. The disorder is likely caused by a mix of things, including:  Your personality, such as being a shy person.  Your behavior, or how you act toward others.  Your thoughts and feelings.  Too much alcohol or drugs.  How you react to stress.  Health and mental problems that you have had for a long time.  Things that hurt you in the past (trauma).  Big changes in your life, such as divorce. What increases the risk? The following factors may make you more likely to develop this condition:  Having family members with depression.  Being a woman.  Problems in the family.  Low levels of some brain chemicals.  Things that caused you pain as a child, especially if you lost a parent or were abused.  A lot of stress in your life, such as from: ? Living without basic needs of life, such as food and shelter. ? Being treated poorly because of race, sex, or religion (discrimination).  Health and mental problems that you have had for a long time. What are the signs or symptoms? The main symptoms of this condition are:  Being sad all the time.  Being grouchy all the time.  Loss of interest in things and activities. Other symptoms include:  Sleeping too much or too little.  Eating too much or too little.  Gaining or losing weight, without knowing why.  Feeling tired or having low energy.  Being restless and weak.  Feeling hopeless, worthless, or guilty.  Trouble thinking clearly or making decisions.  Thoughts of hurting yourself or others, or thoughts of  ending your life.  Spending a lot of time alone.  Inability to complete common tasks of daily life. If you have very bad MDD, you may:  Believe things that are not true.  Hear, see, taste, or feel things that are not there.  Have mild depression that lasts for at least 2 years.  Feel very sad and hopeless.  Have trouble speaking or moving. How is this treated? This condition may be treated with:  Talk therapy. This teaches you to know bad thoughts, feelings, and actions and how to change them. ? This can also help you to communicate with others. ? This can be done with members of your family.  Medicines. These can be used to treat worry (anxiety), depression, or low levels of chemicals in the brain.  Lifestyle changes. You may need to: ? Limit alcohol use. ? Limit drug use. ? Get regular exercise. ? Get plenty of sleep. ? Make healthy eating choices. ? Spend more time outdoors.  Brain stimulation. This treatment excites the brain. This is done when symptoms are very bad or have not gotten better with other treatments. Follow these instructions at home: Activity  Get regular exercise as told.  Spend time outdoors as told.  Make time to do the things you enjoy.  Find ways to deal with stress. Try to: ? Meditate. ? Do deep breathing. ? Spend time in nature. ? Keep a journal.  Return to your normal activities as told by your doctor.   Ask your doctor what activities are safe for you. Alcohol and drug use  If you drink alcohol: ? Limit how much you use to:  0-1 drink a day for women.  0-2 drinks a day for men. ? Be aware of how much alcohol is in your drink. In the [place] one drink equals one 12 oz bottle of beer (355 mL), one 5 oz glass of wine (148 mL), or one 1 oz glass of hard liquor (44 mL).  Talk to your doctor about: ? Alcohol use. Alcohol can affect some medicines. ? Any drug use. General instructions  Take over-the-counter and prescription medicines  and herbal preparations only as told by your doctor.  Eat a healthy diet.  Get a lot of sleep.  Think about joining a support group. Your doctor may be able to suggest one.  Keep all follow-up visits as told by your doctor. This is important.   Where to find more information:  National Alliance on Mental Illness: www.nami.org  [place] National Institute of Mental Health: www.nimh.nih.gov  American Psychiatric Association: www.psychiatry.org/patients-families/ Contact a doctor if:  Your symptoms get worse.  You get new symptoms. Get help right away if:  You hurt yourself.  You have serious thoughts about hurting yourself or others.  You see, hear, taste, smell, or feel things that are not there. If you ever feel like you may hurt yourself or others, or have thoughts about taking your own life, get help right away. Go to your nearest emergency department or:  Call your local emergency services (911 in the [place]  Call a suicide crisis helpline, such as the National Suicide Prevention Lifeline at 1-800-273-8255. This is open 24 hours a day in the [place]  Text the Crisis Text Line at 741741 (in the [place] Summary  Major depressive disorder is a mental health condition. This disorder affects feelings. Symptoms of this condition last most of the day, almost every day, for 2 weeks.  The symptoms of this disorder can cause problems with relationships and with daily activities.  There are treatments and support for people who get this disorder. You may need more than one type of treatment.  Get help right away if you have serious thoughts about hurting yourself or others. This information is not intended to replace advice given to you by your health care provider. Make sure you discuss any questions you have with your health care provider. Document Revised: 09/06/2019 Document Reviewed: 09/06/2019 Elsevier Patient Education  2021 Elsevier Inc.  

## 2021-03-05 LAB — COMPREHENSIVE METABOLIC PANEL
ALT: 13 IU/L (ref 0–44)
AST: 18 IU/L (ref 0–40)
Albumin/Globulin Ratio: 2 (ref 1.2–2.2)
Albumin: 4.9 g/dL (ref 4.0–5.0)
Alkaline Phosphatase: 87 IU/L (ref 44–121)
BUN/Creatinine Ratio: 15 (ref 9–20)
BUN: 16 mg/dL (ref 6–20)
Bilirubin Total: 0.6 mg/dL (ref 0.0–1.2)
CO2: 23 mmol/L (ref 20–29)
Calcium: 9.8 mg/dL (ref 8.7–10.2)
Chloride: 99 mmol/L (ref 96–106)
Creatinine, Ser: 1.07 mg/dL (ref 0.76–1.27)
Globulin, Total: 2.5 g/dL (ref 1.5–4.5)
Glucose: 88 mg/dL (ref 65–99)
Potassium: 4.9 mmol/L (ref 3.5–5.2)
Sodium: 141 mmol/L (ref 134–144)
Total Protein: 7.4 g/dL (ref 6.0–8.5)
eGFR: 94 mL/min/{1.73_m2} (ref >59)

## 2021-03-05 LAB — CBC WITH DIFFERENTIAL/PLATELET
Basophils Absolute: 0 10*3/uL (ref 0.0–0.2)
Basos: 0 %
EOS (ABSOLUTE): 0.1 10*3/uL (ref 0.0–0.4)
Eos: 1 %
Hematocrit: 44.3 % (ref 37.5–51.0)
Hemoglobin: 15.1 g/dL (ref 13.0–17.7)
Immature Grans (Abs): 0 10*3/uL (ref 0.0–0.1)
Immature Granulocytes: 0 %
Lymphocytes Absolute: 1.5 10*3/uL (ref 0.7–3.1)
Lymphs: 23 %
MCH: 29.6 pg (ref 26.6–33.0)
MCHC: 34.1 g/dL (ref 31.5–35.7)
MCV: 87 fL (ref 79–97)
Monocytes Absolute: 0.7 10*3/uL (ref 0.1–0.9)
Monocytes: 10 %
Neutrophils Absolute: 4.3 10*3/uL (ref 1.4–7.0)
Neutrophils: 66 %
Platelets: 241 10*3/uL (ref 150–450)
RBC: 5.1 x10E6/uL (ref 4.14–5.80)
RDW: 12.9 % (ref 11.6–15.4)
WBC: 6.6 10*3/uL (ref 3.4–10.8)

## 2021-03-05 LAB — LIPID PANEL
Chol/HDL Ratio: 3.8 ratio (ref 0.0–5.0)
Cholesterol, Total: 150 mg/dL (ref 100–199)
HDL: 39 mg/dL — ABNORMAL LOW (ref >39)
LDL Chol Calc (NIH): 97 mg/dL (ref 0–99)
Triglycerides: 71 mg/dL (ref 0–149)
VLDL Cholesterol Cal: 14 mg/dL (ref 5–40)

## 2021-03-05 LAB — TSH: TSH: 0.975 u[IU]/mL (ref 0.450–4.500)

## 2021-03-05 LAB — HEMOGLOBIN A1C
Est. average glucose Bld gHb Est-mCnc: 100 mg/dL
Hgb A1c MFr Bld: 5.1 % (ref 4.8–5.6)

## 2021-03-14 NOTE — Progress Notes (Signed)
Labs look good. Will discuss with patient at vsit 6/13

## 2021-03-21 ENCOUNTER — Ambulatory Visit: Payer: No Typology Code available for payment source | Admitting: Nurse Practitioner

## 2021-03-25 ENCOUNTER — Other Ambulatory Visit: Payer: Self-pay

## 2021-03-25 ENCOUNTER — Ambulatory Visit (INDEPENDENT_AMBULATORY_CARE_PROVIDER_SITE_OTHER): Payer: No Typology Code available for payment source | Admitting: Nurse Practitioner

## 2021-03-25 ENCOUNTER — Encounter: Payer: Self-pay | Admitting: Nurse Practitioner

## 2021-03-25 VITALS — BP 97/62 | HR 70 | Temp 98.3°F | Ht 67.0 in | Wt 168.0 lb

## 2021-03-25 DIAGNOSIS — F321 Major depressive disorder, single episode, moderate: Secondary | ICD-10-CM | POA: Diagnosis not present

## 2021-03-25 DIAGNOSIS — F5101 Primary insomnia: Secondary | ICD-10-CM | POA: Diagnosis not present

## 2021-03-25 DIAGNOSIS — R5383 Other fatigue: Secondary | ICD-10-CM

## 2021-03-25 MED ORDER — BUPROPION HCL ER (SR) 100 MG PO TB12: 100.0000 mg | ORAL_TABLET | Freq: Every day | ORAL | 2 refills | Status: DC

## 2021-03-25 MED ORDER — TRAZODONE HCL 50 MG PO TABS: 25.0000 mg | ORAL_TABLET | Freq: Every evening | ORAL | 2 refills | Status: DC | PRN

## 2021-03-25 NOTE — Progress Notes (Signed)
Follow-up of anxiety depression. Established Patient Office Visit  Subjective:  Patient ID: Richard Chang, male    DOB: 1986/06/29  Age: 35 y.o. MRN: [id number]  CC:  Chief Complaint  Patient presents with   Follow-up    HPI [name] presents for follow-up of anxiety and depression.  He was started on Wellbutrin SA 100 mg daily.  States that he is taking this around 7 AM.  He states that he has improved irritability, improved energy, and increased ability to engage and regular activities.  He states he is having some trouble falling asleep now.  He states that he is staying awake until 1 or 2 AM.  He is waking up at 6:30 AM. He had routine fasting labs symptoms last visit.  Labs were all within normal limits. He has no new concerns or complaints today. He denies chest pain, chest pressure, or shortness of breath. He denies headaches or visual disturbances. He denies abdominal pain, nausea, vomiting, or changes in bowel or bladder habits.    Past Medical History:  Diagnosis Date   Anxiety     History reviewed. No pertinent surgical history.  Family History  Problem Relation Age of Onset   Cancer Mother    Cancer Father        skin   Healthy Sister    Healthy Brother    Healthy Son    Healthy Brother    Healthy Son     Social History   Socioeconomic History   Marital status: Married    Spouse name: Not on file   Number of children: Not on file   Years of education: Not on file   Highest education level: Not on file  Occupational History   Not on file  Tobacco Use   Smoking status: Former    Packs/day: 1.00    Years: 6.00    Pack years: 6.00    Types: Cigarettes    Quit date: 10/09/2013    Years since quitting: 7.5   Smokeless tobacco: Former    Types: Chew    Quit date: 05/09/2017  Vaping Use   Vaping Use: Never used  Substance and Sexual Activity   Alcohol use: Yes    Comment: occ   Drug use: No   Sexual activity: Yes    Birth  control/protection: None  Other Topics Concern   Not on file  Social History Narrative   Not on file   Social Determinants of Health   Financial Resource Strain: Not on file  Food Insecurity: Not on file  Transportation Needs: Not on file  Physical Activity: Not on file  Stress: Not on file  Social Connections: Not on file  Intimate Partner Violence: Not on file    Outpatient Medications Prior to Visit  Medication Sig Dispense Refill   buPROPion (WELLBUTRIN SR) 100 MG 12 hr tablet Take 1 tablet (100 mg total) by mouth daily. 30 tablet 1   No facility-administered medications prior to visit.    Allergies  Allergen Reactions   Bee Venom Anaphylaxis   Codeine Other (See Comments)    Unknown   Penicillins Other (See Comments)    Has patient had a PCN reaction causing immediate rash, facial/tongue/throat swelling, SOB or lightheadedness with hypotension: Unknown Has patient had a PCN reaction causing severe rash involving mucus membranes or skin necrosis: Unknown Has patient had a PCN reaction that required hospitalization: Unknown Has patient had a PCN reaction occurring within the last 10 years:  No If all of the above answers are "NO", then may proceed with Cephalosporin use.     ROS Review of Systems  Constitutional:  Positive for fatigue. Negative for chills and fever.  HENT:  Negative for congestion, postnasal drip, rhinorrhea, sinus pressure and sinus pain.   Eyes: Negative.   Respiratory:  Negative for cough, chest tightness, shortness of breath and wheezing.   Cardiovascular:  Negative for chest pain and palpitations.  Gastrointestinal:  Negative for constipation, diarrhea, nausea and vomiting.  Endocrine: Negative for cold intolerance, heat intolerance, polydipsia and polyuria.  Musculoskeletal:  Negative for arthralgias, back pain and myalgias.  Skin:  Negative for rash.  Allergic/Immunologic: Negative.   Neurological:  Negative for dizziness, weakness and  headaches.  Hematological: Negative.   Psychiatric/Behavioral:  Positive for dysphoric mood and sleep disturbance. The patient is nervous/anxious.        Mood improved since starting Wellbutrin.  Now having trouble sleeping.     Objective:    Physical Exam Vitals and nursing note reviewed.  Constitutional:      Appearance: Normal appearance. He is well-developed.  HENT:     Head: Normocephalic and atraumatic.     Nose: Nose normal.     Mouth/Throat:     Mouth: Mucous membranes are moist.  Eyes:     Extraocular Movements: Extraocular movements intact.     Conjunctiva/sclera: Conjunctivae normal.     Pupils: Pupils are equal, round, and reactive to light.  Cardiovascular:     Rate and Rhythm: Normal rate and regular rhythm.     Pulses: Normal pulses.     Heart sounds: Normal heart sounds.  Pulmonary:     Effort: Pulmonary effort is normal.     Breath sounds: Normal breath sounds.  Abdominal:     Palpations: Abdomen is soft.  Musculoskeletal:        General: Normal range of motion.     Cervical back: Normal range of motion and neck supple.  Lymphadenopathy:     Cervical: No cervical adenopathy.  Skin:    General: Skin is warm and dry.     Capillary Refill: Capillary refill takes less than 2 seconds.  Neurological:     General: No focal deficit present.     Mental Status: He is alert and oriented to person, place, and time.  Psychiatric:        Mood and Affect: Mood normal.        Behavior: Behavior normal.        Thought Content: Thought content normal.        Judgment: Judgment normal.    Today's Vitals   03/25/21 0935  BP: 97/62  Pulse: 70  Temp: 98.3 F (36.8 C)  SpO2: 98%  Weight: 168 lb (76.2 kg)  Height: 5' 7" (1.702 m)   Body mass index is 26.31 kg/m.   Wt Readings from Last 3 Encounters:  03/25/21 168 lb (76.2 kg)  03/04/21 169 lb 8 oz (76.9 kg)  08/29/19 175 lb (79.4 kg)     Health Maintenance Due  Topic Date Due   COVID-19 Vaccine (1) Never  done   HIV Screening  Never done   Hepatitis C Screening  Never done    There are no preventive care reminders to display for this patient.  Lab Results  Component Value Date   TSH 0.975 03/04/2021   Lab Results  Component Value Date   WBC 6.6 03/04/2021   HGB 15.1 03/04/2021   HCT 44.3  03/04/2021   MCV 87 03/04/2021   PLT 241 03/04/2021   Lab Results  Component Value Date   NA 141 03/04/2021   K 4.9 03/04/2021   CO2 23 03/04/2021   GLUCOSE 88 03/04/2021   BUN 16 03/04/2021   CREATININE 1.07 03/04/2021   BILITOT 0.6 03/04/2021   ALKPHOS 87 03/04/2021   AST 18 03/04/2021   ALT 13 03/04/2021   PROT 7.4 03/04/2021   ALBUMIN 4.9 03/04/2021   CALCIUM 9.8 03/04/2021   EGFR 94 03/04/2021   GFR 90.99 12/04/2018   Lab Results  Component Value Date   CHOL 150 03/04/2021   Lab Results  Component Value Date   HDL 39 (L) 03/04/2021   Lab Results  Component Value Date   LDLCALC 97 03/04/2021   Lab Results  Component Value Date   TRIG 71 03/04/2021   Lab Results  Component Value Date   CHOLHDL 3.8 03/04/2021   Lab Results  Component Value Date   HGBA1C 5.1 03/04/2021      Assessment & Plan:  1. Other fatigue Likely due to new insomnia.   2. Primary insomnia Trial trazodone 50 mg tablets at bedtime.  May take 1/2 to 1 tablet at bedtime as needed.  We will reassess at next visit. - traZODone (DESYREL) 50 MG tablet; Take 0.5-1 tablets (25-50 mg total) by mouth at bedtime as needed for sleep.  Dispense: 30 tablet; Refill: 2  3. Current moderate episode of major depressive disorder without prior episode ([place]) Improved.  Continue Wellbutrin 100 mg daily. - buPROPion (WELLBUTRIN SR) 100 MG 12 hr tablet; Take 1 tablet (100 mg total) by mouth daily.  Dispense: 30 tablet; Refill: 2   Problem List Items Addressed This Visit       Other   Other fatigue - Primary   Current moderate episode of major depressive disorder without prior episode (HCC)   Relevant  Medications   buPROPion (WELLBUTRIN SR) 100 MG 12 hr tablet   traZODone (DESYREL) 50 MG tablet   Primary insomnia   Relevant Medications   traZODone (DESYREL) 50 MG tablet    Meds ordered this encounter  Medications   buPROPion (WELLBUTRIN SR) 100 MG 12 hr tablet    Sig: Take 1 tablet (100 mg total) by mouth daily.    Dispense:  30 tablet    Refill:  2    Order Specific Question:   Supervising Provider    Answer:   Beatrice Lecher D [[id number]]   traZODone (DESYREL) 50 MG tablet    Sig: Take 0.5-1 tablets (25-50 mg total) by mouth at bedtime as needed for sleep.    Dispense:  30 tablet    Refill:  2    Order Specific Question:   Supervising Provider    Answer:   Beatrice Lecher D [[id number]]    Follow-up: Return in about 2 months (around 05/25/2021) for mood.    Ronnell Freshwater, NP

## 2021-04-09 DIAGNOSIS — F5101 Primary insomnia: Secondary | ICD-10-CM | POA: Insufficient documentation

## 2021-08-02 ENCOUNTER — Encounter: Payer: Self-pay | Admitting: Nurse Practitioner

## 2021-08-02 ENCOUNTER — Other Ambulatory Visit: Payer: Self-pay

## 2021-08-02 ENCOUNTER — Ambulatory Visit (INDEPENDENT_AMBULATORY_CARE_PROVIDER_SITE_OTHER): Payer: No Typology Code available for payment source | Admitting: Nurse Practitioner

## 2021-08-02 VITALS — BP 108/70 | HR 66 | Temp 98.3°F | Ht 67.0 in | Wt 162.9 lb

## 2021-08-02 DIAGNOSIS — F5101 Primary insomnia: Secondary | ICD-10-CM

## 2021-08-02 DIAGNOSIS — R5383 Other fatigue: Secondary | ICD-10-CM | POA: Diagnosis not present

## 2021-08-02 DIAGNOSIS — E663 Overweight: Secondary | ICD-10-CM

## 2021-08-02 DIAGNOSIS — F321 Major depressive disorder, single episode, moderate: Secondary | ICD-10-CM

## 2021-08-02 MED ORDER — FLUOXETINE HCL 20 MG PO CAPS: 20.0000 mg | ORAL_CAPSULE | Freq: Every day | ORAL | 3 refills | Status: DC

## 2021-08-02 MED ORDER — HYDROXYZINE HCL 10 MG PO TABS: ORAL_TABLET | ORAL | 0 refills | Status: DC

## 2021-08-02 NOTE — Progress Notes (Signed)
Established Patient Office Visit  Subjective:  Patient ID: Richard Chang, male    DOB: [birth year]  Age: 35 y.o. MRN: [id number]  CC:  Chief Complaint  Patient presents with   Follow-up   Anxiety    HPI Richard Chang presents for follow up visit. His wife had surgery 04/22/2021 to repair chiari malformation. She has not done well post operatively. Has had two subsequent surgeries due to infections after initial surgery. She has had to have PICC line and IV antibiotics at home. Patient has had to learn how to administer. Iv antibiotics, give injections, and take care of surgical wounds. He has not been able to return to work since his wife's  initial surgery. He does hav considerable situational stress. He also has four young children. Three of his children are in school and one is 54 years old. He is having more trouble with not sleeping well and increased anxiety. Did have a panic attack this morning after they got some bad new about his wife's health. The patient feels like his medications are really not helping him at all. He feels very irritable. States that he often "snaps" and yells at his children for little to no reason. States that evenings are very difficult. This is when everyone is at home, kids and pets act up.  He denies physical concerns or complaints today. He denies chest pain, chest pressure, or shortness of breath. He denies headaches or visual disturbances. He denies abdominal pain, nausea, vomiting, or changes in bowel or bladder habits.    Past Medical History:  Diagnosis Date   Anxiety     No past surgical history on file.  Family History  Problem Relation Age of Onset   Cancer Mother    Cancer Father        skin   Healthy Sister    Healthy Brother    Healthy Son    Healthy Brother    Healthy Son     Social History   Socioeconomic History   Marital status: Married    Spouse name: Not on file   Number of children: Not on file   Years of  education: Not on file   Highest education level: Not on file  Occupational History   Not on file  Tobacco Use   Smoking status: Former    Packs/day: 1.00    Years: 6.00    Pack years: 6.00    Types: Cigarettes    Quit date: 10/09/2013    Years since quitting: 7.8   Smokeless tobacco: Former    Types: Chew    Quit date: 05/09/2017  Vaping Use   Vaping Use: Never used  Substance and Sexual Activity   Alcohol use: Yes    Comment: occ   Drug use: No   Sexual activity: Yes    Birth control/protection: None  Other Topics Concern   Not on file  Social History Narrative   Not on file   Social Determinants of Health   Financial Resource Strain: Not on file  Food Insecurity: Not on file  Transportation Needs: Not on file  Physical Activity: Not on file  Stress: Not on file  Social Connections: Not on file  Intimate Partner Violence: Not on file    Outpatient Medications Prior to Visit  Medication Sig Dispense Refill   buPROPion (WELLBUTRIN SR) 100 MG 12 hr tablet Take 1 tablet (100 mg total) by mouth daily. 30 tablet 2   traZODone (DESYREL) 50 MG  tablet Take 0.5-1 tablets (25-50 mg total) by mouth at bedtime as needed for sleep. 30 tablet 2   No facility-administered medications prior to visit.    Allergies  Allergen Reactions   Bee Venom Anaphylaxis   Codeine Other (See Comments)    Unknown   Penicillins Other (See Comments)    Has patient had a PCN reaction causing immediate rash, facial/tongue/throat swelling, SOB or lightheadedness with hypotension: Unknown Has patient had a PCN reaction causing severe rash involving mucus membranes or skin necrosis: Unknown Has patient had a PCN reaction that required hospitalization: Unknown Has patient had a PCN reaction occurring within the last 10 years: No If all of the above answers are "NO", then may proceed with Cephalosporin use.     ROS Review of Systems  Constitutional:  Positive for fatigue. Negative for activity  change, chills and fever.  HENT:  Negative for congestion, postnasal drip, rhinorrhea, sinus pressure, sinus pain, sneezing and sore throat.   Eyes: Negative.   Respiratory:  Negative for cough, shortness of breath and wheezing.   Cardiovascular:  Negative for chest pain and palpitations.  Gastrointestinal:  Negative for constipation, diarrhea, nausea and vomiting.  Endocrine: Negative for cold intolerance, heat intolerance, polydipsia and polyuria.  Genitourinary:  Negative for dysuria, frequency and urgency.  Musculoskeletal:  Negative for back pain and myalgias.  Skin:  Negative for rash.  Allergic/Immunologic: Negative for environmental allergies.  Neurological:  Negative for dizziness, weakness and headaches.  Psychiatric/Behavioral:  Positive for agitation, dysphoric mood and sleep disturbance. The patient is nervous/anxious.      Objective:    Physical Exam Vitals and nursing note reviewed.  Constitutional:      Appearance: Normal appearance. He is well-developed.  HENT:     Head: Normocephalic and atraumatic.     Nose: Nose normal.     Mouth/Throat:     Mouth: Mucous membranes are moist.  Eyes:     Extraocular Movements: Extraocular movements intact.     Conjunctiva/sclera: Conjunctivae normal.     Pupils: Pupils are equal, round, and reactive to light.  Cardiovascular:     Rate and Rhythm: Normal rate and regular rhythm.     Pulses: Normal pulses.     Heart sounds: Normal heart sounds.  Pulmonary:     Effort: Pulmonary effort is normal.     Breath sounds: Normal breath sounds.  Abdominal:     Palpations: Abdomen is soft.  Musculoskeletal:        General: Normal range of motion.     Cervical back: Normal range of motion and neck supple.  Lymphadenopathy:     Cervical: No cervical adenopathy.  Skin:    General: Skin is warm and dry.     Capillary Refill: Capillary refill takes less than 2 seconds.  Neurological:     General: No focal deficit present.     Mental  Status: He is alert and oriented to person, place, and time.  Psychiatric:        Attention and Perception: Attention and perception normal.        Mood and Affect: Affect normal. Mood is anxious and depressed.        Speech: Speech normal.        Behavior: Behavior normal. Behavior is cooperative.        Thought Content: Thought content normal.        Cognition and Memory: Cognition and memory normal.        Judgment: Judgment normal.  Today's Vitals   08/02/21 1514  BP: 108/70  Pulse: 66  Temp: 98.3 F (36.8 C)  SpO2: 99%  Weight: 162 lb 14.4 oz (73.9 kg)  Height: '5\' 7"'  (1.702 m)   Body mass index is 25.51 kg/m.   Wt Readings from Last 3 Encounters:  08/02/21 162 lb 14.4 oz (73.9 kg)  03/25/21 168 lb (76.2 kg)  03/04/21 169 lb 8 oz (76.9 kg)     Health Maintenance Due  Topic Date Due   COVID-19 Vaccine (1) Never done   HIV Screening  Never done   Hepatitis C Screening  Never done   INFLUENZA VACCINE  Never done    There are no preventive care reminders to display for this patient.  Lab Results  Component Value Date   TSH 0.975 03/04/2021   Lab Results  Component Value Date   WBC 6.6 03/04/2021   HGB 15.1 03/04/2021   HCT 44.3 03/04/2021   MCV 87 03/04/2021   PLT 241 03/04/2021   Lab Results  Component Value Date   NA 141 03/04/2021   K 4.9 03/04/2021   CO2 23 03/04/2021   GLUCOSE 88 03/04/2021   BUN 16 03/04/2021   CREATININE 1.07 03/04/2021   BILITOT 0.6 03/04/2021   ALKPHOS 87 03/04/2021   AST 18 03/04/2021   ALT 13 03/04/2021   PROT 7.4 03/04/2021   ALBUMIN 4.9 03/04/2021   CALCIUM 9.8 03/04/2021   EGFR 94 03/04/2021   GFR 90.99 12/04/2018   Lab Results  Component Value Date   CHOL 150 03/04/2021   Lab Results  Component Value Date   HDL 39 (L) 03/04/2021   Lab Results  Component Value Date   LDLCALC 97 03/04/2021   Lab Results  Component Value Date   TRIG 71 03/04/2021   Lab Results  Component Value Date   CHOLHDL 3.8  03/04/2021   Lab Results  Component Value Date   HGBA1C 5.1 03/04/2021      Assessment & Plan:  1. Other fatigue Patient with increased fatigue secondary to increased depression and increased situational stress.  Will monitor.  2. Current moderate episode of major depressive disorder without prior episode (HCC) Discontinue Wellbutrin.  Start fluoxetine 20 mg capsules daily.  Discussed referral to psychiatry and counseling services.  Patient currently declining the services due to lack of time while taking care of his wife and young children.  We will reassess at next visit. - FLUoxetine (PROZAC) 20 MG capsule; Take 1 capsule (20 mg total) by mouth daily.  Dispense: 90 capsule; Refill: 3  3. Primary insomnia Discontinue trazodone.  Trial of hydroxyzine 10 mg tablets.  May take 1 tablet twice daily for acute anxiety.  He may take up to 2 tablets at night as needed for acute insomnia.  We will reassess at next visit. - hydrOXYzine (ATARAX/VISTARIL) 10 MG tablet; Take 1 tablet po BID prn for acute anxiety and take 2 talets po QHS prn insomnia  Dispense: 30 tablet; Refill: 0   Problem List Items Addressed This Visit       Other   Other fatigue - Primary   Current moderate episode of major depressive disorder without prior episode (HCC)   Relevant Medications   FLUoxetine (PROZAC) 20 MG capsule   hydrOXYzine (ATARAX/VISTARIL) 10 MG tablet   Primary insomnia   Relevant Medications   hydrOXYzine (ATARAX/VISTARIL) 10 MG tablet    Meds ordered this encounter  Medications   FLUoxetine (PROZAC) 20 MG capsule  Sig: Take 1 capsule (20 mg total) by mouth daily.    Dispense:  90 capsule    Refill:  3    Please fill as tablet if patient's insurance prefers. Thanks    Order Specific Question:   Supervising Provider    Answer:   Beatrice Lecher D [[id number]]   hydrOXYzine (ATARAX/VISTARIL) 10 MG tablet    Sig: Take 1 tablet po BID prn for acute anxiety and take 2 talets po QHS prn insomnia     Dispense:  30 tablet    Refill:  0    Order Specific Question:   Supervising Provider    Answer:   Beatrice Lecher D [[id number]]    Follow-up: Return in about 3 weeks (around 08/23/2021) for 3 - 4 weeks for mood, anxiety .    Ronnell Freshwater, NP  This note was dictated using Systems analyst. Rapid proofreading was performed to expedite the delivery of the information. Despite proofreading, phonetic errors will occur which are common with this voice recognition software. Please take this into consideration. If there are any concerns, please contact our office.

## 2021-08-10 NOTE — Patient Instructions (Signed)

## 2021-08-29 ENCOUNTER — Other Ambulatory Visit: Payer: Self-pay | Admitting: Nurse Practitioner

## 2021-08-29 ENCOUNTER — Encounter: Payer: Self-pay | Admitting: Nurse Practitioner

## 2021-08-29 DIAGNOSIS — F5101 Primary insomnia: Secondary | ICD-10-CM

## 2021-08-29 MED ORDER — HYDROXYZINE HCL 10 MG PO TABS: ORAL_TABLET | ORAL | 1 refills | Status: DC

## 2021-12-15 ENCOUNTER — Encounter: Payer: Self-pay | Admitting: Nurse Practitioner

## 2022-01-09 ENCOUNTER — Other Ambulatory Visit: Payer: Self-pay | Admitting: Nurse Practitioner

## 2022-01-09 DIAGNOSIS — F5101 Primary insomnia: Secondary | ICD-10-CM

## 2022-05-25 ENCOUNTER — Encounter: Payer: Self-pay | Admitting: Nurse Practitioner

## 2022-06-05 ENCOUNTER — Ambulatory Visit (INDEPENDENT_AMBULATORY_CARE_PROVIDER_SITE_OTHER): Payer: Medicaid Other | Admitting: Nurse Practitioner

## 2022-06-05 ENCOUNTER — Encounter: Payer: Self-pay | Admitting: Nurse Practitioner

## 2022-06-05 VITALS — BP 122/82 | HR 66 | Ht 67.0 in | Wt 193.8 lb

## 2022-06-05 DIAGNOSIS — M79641 Pain in right hand: Secondary | ICD-10-CM | POA: Diagnosis not present

## 2022-06-05 DIAGNOSIS — S6991XA Unspecified injury of right wrist, hand and finger(s), initial encounter: Secondary | ICD-10-CM | POA: Diagnosis not present

## 2022-06-05 MED ORDER — KETOROLAC TROMETHAMINE 30 MG/ML IJ SOLN
30.0000 mg | Freq: Once | INTRAMUSCULAR | Status: AC
  Administered 2022-06-05: 30 mg via INTRAVENOUS

## 2022-06-05 MED ORDER — METHYLPREDNISOLONE 4 MG PO TBPK: ORAL_TABLET | ORAL | 0 refills | Status: DC

## 2022-06-05 MED ORDER — METHYLPREDNISOLONE SODIUM SUCC 40 MG IJ SOLR
40.0000 mg | Freq: Once | INTRAMUSCULAR | Status: AC
  Administered 2022-06-05: 40 mg via INTRAMUSCULAR

## 2022-06-05 MED ORDER — CELECOXIB 200 MG PO CAPS: 200.0000 mg | ORAL_CAPSULE | Freq: Two times a day (BID) | ORAL | 0 refills | Status: DC | PRN

## 2022-06-05 NOTE — Progress Notes (Signed)
Established patient visit   Patient: Richard Chang   DOB: [birth year]   36 y.o. Male  MRN: [id number] Visit Date: 06/05/2022  Chief Complaint  Patient presents with   Hand Pain   Subjective    Hand Pain  Pertinent negatives include no chest pain.    Patient states that a few weeks ago, he grabbed his dog's collar as she was getting ready to run out the door. Felt the tendon in his hand pop and is now having moderate to severe pain. Grip strength is weak. Pain is 8/10 in severity. Has been taking tylenol and aleve which is not helping at all. States that he was already trying to get appointment because of pain in this area.  Able to flex and extend at the wrist. Turning the hand from front to back causes the most amount of pain. Able to make a fist, though grip strength is weak.   Medications: Outpatient Medications Prior to Visit  Medication Sig   FLUoxetine (PROZAC) 20 MG capsule Take 1 capsule (20 mg total) by mouth daily.   hydrOXYzine (ATARAX) 10 MG tablet TAKE 1 TAB TWICE DAILY AS NEEDED FOR ACUTE ANXIETY AND TAKE 2 TABS AT BEDTIME AS NEEDED FOR INSOMNIA   No facility-administered medications prior to visit.    Review of Systems  Constitutional:  Negative for activity change, chills, fatigue and fever.  HENT:  Negative for congestion, postnasal drip, rhinorrhea, sinus pressure, sinus pain, sneezing and sore throat.   Eyes: Negative.   Respiratory:  Negative for cough, shortness of breath and wheezing.   Cardiovascular:  Negative for chest pain and palpitations.  Gastrointestinal:  Negative for constipation, diarrhea, nausea and vomiting.  Endocrine: Negative for cold intolerance, heat intolerance, polydipsia and polyuria.  Genitourinary:  Negative for dysuria, frequency and urgency.  Musculoskeletal:  Positive for arthralgias and joint swelling. Negative for back pain and myalgias.       Difficult to make a fist and turn his hand at the wrist   Skin:  Negative for rash.   Allergic/Immunologic: Negative for environmental allergies.  Neurological:  Negative for dizziness, weakness and headaches.  Psychiatric/Behavioral:  The patient is not nervous/anxious.      Objective     Today's Vitals   06/05/22 1529  BP: 122/82  Pulse: 66  SpO2: 98%  Weight: 193 lb 12.8 oz (87.9 kg)   Body mass index is 30.35 kg/m.   Physical Exam Vitals and nursing note reviewed.  Constitutional:      Appearance: Normal appearance. He is well-developed.     Comments: Appears to be in pain   HENT:     Head: Normocephalic and atraumatic.     Nose: Nose normal.     Mouth/Throat:     Mouth: Mucous membranes are moist.     Pharynx: Oropharynx is clear.  Eyes:     Extraocular Movements: Extraocular movements intact.     Conjunctiva/sclera: Conjunctivae normal.     Pupils: Pupils are equal, round, and reactive to light.  Cardiovascular:     Rate and Rhythm: Normal rate and regular rhythm.     Pulses: Normal pulses.     Heart sounds: Normal heart sounds.  Pulmonary:     Effort: Pulmonary effort is normal.     Breath sounds: Normal breath sounds.  Abdominal:     Palpations: Abdomen is soft.  Musculoskeletal:     Right hand: Swelling and tenderness present. Decreased range of motion. Decreased strength.  Cervical back: Normal range of motion and neck supple.     Comments: There is tenderness with palpation of the right hand across the palmar surface. This is in center of the hand, stretching down to center of the wrist. Grip strength is diminished. Ability to supinate and pronate the right hand is diminished as well. Flexion and extension of the wrist is intact. There is not bony abnormality or deformity noted at this time.   Lymphadenopathy:     Cervical: No cervical adenopathy.  Skin:    General: Skin is warm and dry.     Capillary Refill: Capillary refill takes less than 2 seconds.  Neurological:     General: No focal deficit present.     Mental Status: He is  alert and oriented to person, place, and time.  Psychiatric:        Mood and Affect: Mood normal.        Behavior: Behavior normal.        Thought Content: Thought content normal.        Judgment: Judgment normal.       Assessment & Plan     1. Injury of right hand, initial encounter Start celebrex 200 mg twice daily as needed for pain and inflammation. Add medrol taper. Take as directed for 6 days. Refer to orthopedics for further evaluation.  - Ambulatory referral to Orthopedic Surgery - methylPREDNISolone (MEDROL) 4 MG TBPK tablet; Take by mouth as directed for 6 days  Dispense: 21 tablet; Refill: 0 - celecoxib (CELEBREX) 200 MG capsule; Take 1 capsule (200 mg total) by mouth 2 (two) times daily as needed.  Dispense: 60 capsule; Refill: 0  2. Right hand pain Toradol 30 mg and SoluMedrol 40 mg injections given in the hospital. Start celebrex 200 mg twice daily as needed for pain and inflammation. Add medrol taper. Take as directed for 6 days. Refer to orthopedics for further evaluation.  - Ambulatory referral to Orthopedic Surgery - methylPREDNISolone (MEDROL) 4 MG TBPK tablet; Take by mouth as directed for 6 days  Dispense: 21 tablet; Refill: 0 - celecoxib (CELEBREX) 200 MG capsule; Take 1 capsule (200 mg total) by mouth 2 (two) times daily as needed.  Dispense: 60 capsule; Refill: 0 - ketorolac (TORADOL) 30 MG/ML injection 30 mg - methylPREDNISolone sodium succinate (SOLU-MEDROL) 40 mg/mL injection 40 mg   Return for prn worsening or persistent symptoms.        Carlean Jews, NP  [facility] [phone] (phone) [phone] (fax)  [facility]

## 2024-05-20 ENCOUNTER — Telehealth: Admitting: Family Medicine

## 2024-05-20 ENCOUNTER — Ambulatory Visit: Payer: Self-pay

## 2024-05-20 DIAGNOSIS — L258 Unspecified contact dermatitis due to other agents: Secondary | ICD-10-CM

## 2024-05-20 MED ORDER — HYDROXYZINE HCL 10 MG PO TABS: 10.0000 mg | ORAL_TABLET | Freq: Three times a day (TID) | ORAL | 0 refills | Status: AC | PRN

## 2024-05-20 MED ORDER — METHYLPREDNISOLONE 4 MG PO TBPK: ORAL_TABLET | ORAL | 0 refills | Status: AC

## 2024-05-20 NOTE — Telephone Encounter (Signed)
 FYI Only or Action Required?: FYI only for provider.  Patient was last seen in primary care on 06/05/2022 by Hanford Powell BRAVO, NP.  Called Nurse Triage reporting Rash.  Symptoms began several days ago.  Interventions attempted: OTC medications: benadryl .  Symptoms are: gradually worsening.  Triage Disposition: See HCP Within 4 Hours (Or PCP Triage)  Patient/caregiver understands and will follow disposition?: Yes, will follow disposition  Reason for Disposition  Large or small blisters on skin (i.e., fluid filled bubbles or sacs)  Protocols used: Rash or Redness - [facility]

## 2024-05-20 NOTE — Progress Notes (Signed)
 Virtual Visit Consent   Richard Chang, you are scheduled for a virtual visit with a [facility] provider today. Just as with appointments in the office, your consent must be obtained to participate. Your consent will be active for this visit and any virtual visit you may have with one of our providers in the next 365 days. If you have a MyChart account, a copy of this consent can be sent to you electronically.  As this is a virtual visit, video technology does not allow for your provider to perform a traditional examination. This may limit your provider's ability to fully assess your condition. If your provider identifies any concerns that need to be evaluated in person or the need to arrange testing (such as labs, EKG, etc.), we will make arrangements to do so. Although advances in technology are sophisticated, we cannot ensure that it will always work on either your end or our end. If the connection with a video visit is poor, the visit may have to be switched to a telephone visit. With either a video or telephone visit, we are not always able to ensure that we have a secure connection.  By engaging in this virtual visit, you consent to the provision of healthcare and authorize for your insurance to be billed (if applicable) for the services provided during this visit. Depending on your insurance coverage, you may receive a charge related to this service.  I need to obtain your verbal consent now. Are you willing to proceed with your visit today? Richard Chang has provided verbal consent on 05/20/2024 for a virtual visit (video or telephone). Olam DELENA Darby, FNP  Date: 05/20/2024 11:32 AM   Virtual Visit via Video Note   I, Olam DELENA Darby, connected with  Richard Chang  ([id number], [birth year]) on 05/20/24 at 11:15 AM EDT by a video-enabled telemedicine application and verified that I am speaking with the correct person using two identifiers.  Location: Patient: Virtual Visit Location  Patient: Home Provider: Virtual Visit Location Provider: Home Office   I discussed the limitations of evaluation and management by telemedicine and the availability of in person appointments. The patient expressed understanding and agreed to proceed.    History of Present Illness: Richard Chang is a 38 y.o. male for rash 3 days ago. Has been moving deer stands on Saturday during the day. Then Saturday night developed rash. Did not shower that night (got home at 1 am). Rash started on knees bilaterally. Itching significantly and is now on calves, thighs, chest, arms, ankles, hands. Removed two ticks that had bitten him described as seed ticks one on left hip, other on right leg.  Looks like normal bug bites now. Removed those on Sunday. Right hand was swollen yesterday, better today. No joint pain. Taking Benadryl  and ibuprofen. Taken prednisone  in the past but felt really jittery. Did ok with Methylprednisolone  in 2023. Has since showered and washed sheets.  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/09/2021   Current moderate episode of major depressive disorder without prior episode (HCC) 03/04/2021   Diarrhea 09/24/2018   Other fatigue 09/24/2018   Bilateral shoulder pain 05/23/2017   Bilateral wrist pain 05/23/2017   Right hand pain 05/23/2017   Healthcare maintenance 05/23/2017    Allergies:  Allergies  Allergen Reactions   Bee Venom Anaphylaxis   Codeine Other (See Comments)    Unknown   Penicillins Other (See Comments)  Has patient had a PCN reaction causing immediate rash, facial/tongue/throat swelling, SOB or lightheadedness with hypotension: Unknown Has patient had a PCN reaction causing severe rash involving mucus membranes or skin necrosis: Unknown Has patient had a PCN reaction that required hospitalization: Unknown Has patient had a PCN reaction occurring within the last 10 years: No If all of the above answers are  NO, then may proceed with Cephalosporin use.    Medications:  Current Outpatient Medications:    hydrOXYzine  (ATARAX ) 10 MG tablet, Take 1-2 tablets (10-20 mg total) by mouth 3 (three) times daily as needed for itching., Disp: 60 tablet, Rfl: 0   methylPREDNISolone  (MEDROL ) 4 MG TBPK tablet, Take by mouth as directed on box for 6 days, Disp: 21 tablet, Rfl: 0  Observations/Objective: Patient is well-developed, well-nourished in no acute distress.  Resting comfortably at home.  Head is normocephalic, atraumatic.  No labored breathing. Speech is clear and coherent with logical content.  Patient is alert and oriented at baseline.   Assessment and Plan: 1. Contact dermatitis due to other agent, unspecified contact dermatitis type (Primary) - methylPREDNISolone  (MEDROL ) 4 MG TBPK tablet; Take by mouth as directed on box for 6 days  Dispense: 21 tablet; Refill: 0 - hydrOXYzine  (ATARAX ) 10 MG tablet; Take 1-2 tablets (10-20 mg total) by mouth 3 (three) times daily as needed for itching.  Dispense: 60 tablet; Refill: 0  Discussed risks and benefits of oral steroid. Start today No s/s of infection however discussed symptoms to watch out for AVS handout provided for medications and poison ivy  Based on what you have shared with me it looks like you have had an allergic reaction to the oily resin from a group of plants.  This resin is very sticky, so it easily attaches to your skin, clothing, tools equipment, and pet's fur.    This blistering rash is often called poison ivy rash although it can come from contact with the leaves, stems and roots of poison ivy, poison oak and poison sumac.  The oily resin contains urushiol (u-ROO-she-ol) that produces a skin rash on exposed skin.  The severity of the rash depends on the amount of urushiol that gets on your skin.  A section of skin with more urushiol on it may develop a rash sooner.  The rash usually develops 12-48 hours after exposure and can  last two to three weeks.  Your skin must come in direct contact with the plant's oil to be affected.  Blister fluid doesn't spread the rash.  However, if you come into contact with a piece of clothing or pet fur that has urushiol on it, the rash may spread out.  You can also transfer the oil to other parts of your body with your fingers.  Often the rash looks like a straight line because of the way the plant brushes against your skin.  Since your rash is widespread or has resulted in a large number of blisters, I have prescribed an oral corticosteroid.  Please follow these recommendations:  I have sent a prednisone  dose pack to your chosen pharmacy. Be sure to follow the instructions carefully and complete the entire prescription. You may use Benadryl  or Caladryl topical lotions to sooth the itch and remember cool, not hot, showers and baths can help relieve the itching!  Place cool, wet compresses on the affected area for 15-30 minutes several times a day.  You may also take oral antihistamines, such as diphenhydramine  (Benadryl , others), which may also help you  sleep better.  Watch your skin for any purulent (pus) drainage or red streaking from the site.  If this occurs, contact your provider.  You may require an antibiotic for a skin infection.  Make sure that the clothes you were wearing as well as any towels or sheets that may have come in contact with the oil (urushiol) are washed in detergent and hot water.       I have developed the following plan to treat your condition Solumedrol Dose pack.  What can you do to prevent this rash?  Avoid the plants.  Learn how to identify poison ivy, poison oak and poison sumac in all seasons.  When hiking or engaging in other activities that might expose you to these plants, try to stay on cleared pathways.  If camping, make sure you pitch your tent in an area free of these plants.  Keep pets from running through wooded areas so that urushiol doesn't accidentally  stick to their fur, which you may touch.  Remove or kill the plants.  In your yard, you can get rid of poison ivy by applying an herbicide or pulling it out of the ground, including the roots, while wearing heavy gloves.  Afterward remove the gloves and thoroughly wash them and your hands.  Don't burn poison ivy or related plants because the urushiol can be carried by smoke.  Wear protective clothing.  If needed, protect your skin by wearing socks, boots, pants, long sleeves and vinyl gloves.  Wash your skin right away.  Washing off the oil with soap and water within 30 minutes of exposure may reduce your chances of getting a poison ivy rash.  Even washing after an hour or so can help reduce the severity of the rash.  If you walk through some poison ivy and then later touch your shoes, you may get some urushiol on your hands, which may then transfer to your face or body by touching or rubbing.  If the contaminated object isn't cleaned, the urushiol on it can still cause a skin reaction years later.  Be careful not to reuse towels after you have washed your skin.  Also carefully wash clothing in detergent and hot water to remove all traces of the oil.  Handle contaminated clothing carefully so you don't transfer the urushiol to yourself, furniture, rugs or appliances.  Remember that pets can carry the oil on their fur and paws.  If you think your pet may be contaminated with urushiol, put on some long rubber gloves and give your pet a bath.  Finally, be careful not to burn these plants as the smoke can contain traces of the oil.  Inhaling the smoke may result in difficulty breathing. If that occurred you should see a physician as soon as possible.  See your doctor right away if:  The reaction is severe or widespread You inhaled the smoke from burning poison ivy and are having difficulty breathing Your skin continues to swell The rash affects your eyes, mouth or genitals Blisters are oozing  pus You develop a fever greater than 100 F (37.8 C) The rash doesn't get better within a few weeks.  If you scratch the poison ivy rash, bacteria under your fingernails may cause the skin to become infected.  See your doctor if pus starts oozing from the blisters.  Treatment generally includes antibiotics.  Poison ivy treatments are usually limited to self-care methods.  And the rash typically goes away on its own in two to  three weeks.     If the rash is widespread or results in a large number of blisters, your doctor may prescribe an oral corticosteroid, such as prednisone .  If a bacterial infection has developed at the rash site, your doctor may give you a prescription for an oral antibiotic.   Follow Up Instructions: I discussed the assessment and treatment plan with the patient. The patient was provided an opportunity to ask questions and all were answered. The patient agreed with the plan and demonstrated an understanding of the instructions.  A copy of instructions were sent to the patient via MyChart unless otherwise noted below.   The patient was advised to call back or seek an in-person evaluation if the symptoms worsen or if the condition fails to improve as anticipated.    Olam DELENA Darby, FNP

## 2024-05-20 NOTE — Telephone Encounter (Signed)
 Copied from CRM [id number]. Topic: Clinical - Red Word Triage >> May 20, 2024 10:43 AM Selinda RAMAN wrote: Red Word that prompted transfer to Nurse Triage: Cesar the spouse of the patient called in stating they both picked up bad chigger bites while moving deer stands in the woods on Saturday. She sates he has them on his sides as well as down to his genitals and is worse off than she is. They have tried calamine lotion, benedryl and oatmeal baths. None have seemed to help to much. I will transfer her to E2C2 NT Answer Assessment - Initial Assessment Questions 1. APPEARANCE of RASH: What does the rash look like? (e.g., blisters, dry flaky skin, red spots, redness, sores)     blistery 2. SIZE: How big are the spots? (e.g., tip of pen, eraser, coin; inches, centimeters)     Pea sized to pencil point 3. LOCATION: Where is the rash located?     Groin, legs 4. COLOR: What color is the rash? (Note: It is difficult to assess rash color in people with darker-colored skin. When this situation occurs, simply ask the caller to describe what they see.)     red 5. ONSET: When did the rash begin?     3 days ago 6. FEVER: Do you have a fever? If Yes, ask: What is your temperature, how was it measured, and when did it start?     denies 7. ITCHING: Does the rash itch? If Yes, ask: How bad is the itch? (Scale 1-10; or mild, moderate, severe)     severe 8. CAUSE: What do you think is causing the rash?     Mites, bugs out in the woods 10. OTHER SYMPTOMS: Do you have any other symptoms? (e.g., dizziness, headache, sore throat, joint pain)       Hands started swelling a bit,  Protocols used: Rash or Redness - [facility]

## 2024-05-20 NOTE — Patient Instructions (Addendum)
 Melquisedec A Reim, thank you for joining Olam DELENA Darby, FNP for today's virtual visit.  While this provider is not your primary care provider (PCP), if your PCP is located in our provider database this encounter information will be shared with them immediately following your visit.   A [facility] MyChart account gives you access to today's visit and all your visits, tests, and labs performed at [facility]  click here if you don't have a [facility] MyChart account or go to [email or web address]  Consent: (Patient) Richard Chang provided verbal consent for this virtual visit at the beginning of the encounter.  Current Medications:  Current Outpatient Medications:    hydrOXYzine  (ATARAX ) 10 MG tablet, Take 1-2 tablets (10-20 mg total) by mouth 3 (three) times daily as needed for itching., Disp: 60 tablet, Rfl: 0   methylPREDNISolone  (MEDROL ) 4 MG TBPK tablet, Take by mouth as directed on box for 6 days, Disp: 21 tablet, Rfl: 0   Medications ordered in this encounter:  Meds ordered this encounter  Medications   methylPREDNISolone  (MEDROL ) 4 MG TBPK tablet    Sig: Take by mouth as directed on box for 6 days    Dispense:  21 tablet    Refill:  0    Supervising Provider:   BLAISE ALEENE KIDD [[id number]]   hydrOXYzine  (ATARAX ) 10 MG tablet    Sig: Take 1-2 tablets (10-20 mg total) by mouth 3 (three) times daily as needed for itching.    Dispense:  60 tablet    Refill:  0    Supervising Provider:   BLAISE ALEENE KIDD [[id number]]     *If you need refills on other medications prior to your next appointment, please contact your pharmacy*  Follow-Up: Call back or seek an in-person evaluation if the symptoms worsen or if the condition fails to improve as anticipated.  [facility] [phone]  Other Instructions Discussed risks and benefits of oral steroid. Start today No s/s of infection however discussed symptoms to watch out for AVS handout provided for  medications and poison ivy  Based on what you have shared with me it looks like you have had an allergic reaction to the oily resin from a group of plants.  This resin is very sticky, so it easily attaches to your skin, clothing, tools equipment, and pet's fur.    This blistering rash is often called poison ivy rash although it can come from contact with the leaves, stems and roots of poison ivy, poison oak and poison sumac.  The oily resin contains urushiol (u-ROO-she-ol) that produces a skin rash on exposed skin.  The severity of the rash depends on the amount of urushiol that gets on your skin.  A section of skin with more urushiol on it may develop a rash sooner.  The rash usually develops 12-48 hours after exposure and can last two to three weeks.  Your skin must come in direct contact with the plant's oil to be affected.  Blister fluid doesn't spread the rash.  However, if you come into contact with a piece of clothing or pet fur that has urushiol on it, the rash may spread out.  You can also transfer the oil to other parts of your body with your fingers.  Often the rash looks like a straight line because of the way the plant brushes against your skin.  Since your rash is widespread or has resulted in a large number of blisters, I have prescribed an oral  corticosteroid.  Please follow these recommendations:  I have sent a prednisone  dose pack to your chosen pharmacy. Be sure to follow the instructions carefully and complete the entire prescription. You may use Benadryl  or Caladryl topical lotions to sooth the itch and remember cool, not hot, showers and baths can help relieve the itching!  Place cool, wet compresses on the affected area for 15-30 minutes several times a day.  You may also take oral antihistamines, such as diphenhydramine  (Benadryl , others), which may also help you sleep better.  Watch your skin for any purulent (pus) drainage or red streaking from the site.  If this occurs, contact  your provider.  You may require an antibiotic for a skin infection.  Make sure that the clothes you were wearing as well as any towels or sheets that may have come in contact with the oil (urushiol) are washed in detergent and hot water.       I have developed the following plan to treat your condition Solumedrol Dose pack.  What can you do to prevent this rash?  Avoid the plants.  Learn how to identify poison ivy, poison oak and poison sumac in all seasons.  When hiking or engaging in other activities that might expose you to these plants, try to stay on cleared pathways.  If camping, make sure you pitch your tent in an area free of these plants.  Keep pets from running through wooded areas so that urushiol doesn't accidentally stick to their fur, which you may touch.  Remove or kill the plants.  In your yard, you can get rid of poison ivy by applying an herbicide or pulling it out of the ground, including the roots, while wearing heavy gloves.  Afterward remove the gloves and thoroughly wash them and your hands.  Don't burn poison ivy or related plants because the urushiol can be carried by smoke.  Wear protective clothing.  If needed, protect your skin by wearing socks, boots, pants, long sleeves and vinyl gloves.  Wash your skin right away.  Washing off the oil with soap and water within 30 minutes of exposure may reduce your chances of getting a poison ivy rash.  Even washing after an hour or so can help reduce the severity of the rash.  If you walk through some poison ivy and then later touch your shoes, you may get some urushiol on your hands, which may then transfer to your face or body by touching or rubbing.  If the contaminated object isn't cleaned, the urushiol on it can still cause a skin reaction years later.  Be careful not to reuse towels after you have washed your skin.  Also carefully wash clothing in detergent and hot water to remove all traces of the oil.  Handle contaminated  clothing carefully so you don't transfer the urushiol to yourself, furniture, rugs or appliances.  Remember that pets can carry the oil on their fur and paws.  If you think your pet may be contaminated with urushiol, put on some long rubber gloves and give your pet a bath.  Finally, be careful not to burn these plants as the smoke can contain traces of the oil.  Inhaling the smoke may result in difficulty breathing. If that occurred you should see a physician as soon as possible.  See your doctor right away if:  The reaction is severe or widespread You inhaled the smoke from burning poison ivy and are having difficulty breathing Your skin continues to swell The  rash affects your eyes, mouth or genitals Blisters are oozing pus You develop a fever greater than 100 F (37.8 C) The rash doesn't get better within a few weeks.  If you scratch the poison ivy rash, bacteria under your fingernails may cause the skin to become infected.  See your doctor if pus starts oozing from the blisters.  Treatment generally includes antibiotics.  Poison ivy treatments are usually limited to self-care methods.  And the rash typically goes away on its own in two to three weeks.     If the rash is widespread or results in a large number of blisters, your doctor may prescribe an oral corticosteroid, such as prednisone .  If a bacterial infection has developed at the rash site, your doctor may give you a prescription for an oral antibiotic.   If you have been instructed to have an in-person evaluation today at a local Urgent Care facility, please use the link below. It will take you to a list of all of our available [facility], including address, phone number and hours of operation. Please do not delay care.  [facility]  If you or a family member do not have a primary care provider, use the link below to schedule a visit and establish care. When you choose a [facility] primary care  physician or advanced practice provider, you gain a long-term partner in health. Find a Primary Care Provider  Learn more about [facility]'s in-office and virtual care options:  - Get Care Now

## 2024-05-22 NOTE — Telephone Encounter (Signed)
 Pt was seen via video for this.

## 2024-05-26 ENCOUNTER — Ambulatory Visit (INDEPENDENT_AMBULATORY_CARE_PROVIDER_SITE_OTHER): Admitting: Family Medicine

## 2024-05-26 ENCOUNTER — Encounter: Payer: Self-pay | Admitting: Family Medicine

## 2024-05-26 VITALS — BP 101/68 | HR 65 | Ht 67.0 in | Wt 188.2 lb

## 2024-05-26 DIAGNOSIS — H9193 Unspecified hearing loss, bilateral: Secondary | ICD-10-CM | POA: Insufficient documentation

## 2024-05-26 DIAGNOSIS — F321 Major depressive disorder, single episode, moderate: Secondary | ICD-10-CM

## 2024-05-26 NOTE — Patient Instructions (Signed)
 It was nice to see you today,  We addressed the following topics today: -  I am sending in a referral to the ear nose and throat specialist.  Someone should call you to schedule this - If you are interested in taking an antidepressant that is unlikely to cause any weight gain, I would recommend bupropion /Wellbutrin .  I have provided some information on this in your paperwork.  If you would like to start this let us  know and we can send it in and schedule you a follow-up visit.  Have a great day,  Rolan Slain, MD

## 2024-05-26 NOTE — Progress Notes (Signed)
   Acute Office Visit  Subjective:     Patient ID: Richard Chang, male    DOB: [birth year], 38 y.o.   MRN: [id number]  Chief Complaint  Patient presents with   Ear Problem    HPI Patient is in today for   Subjective - Gradually worsening hearing loss over years. History of exposure to loud noises including firearms, heavy equipment, and jackhammers. Reports inconsistent use of hearing protection in the past, but more frequent use recently. Denies significant history of childhood ear infections. - Tinnitus present for months to years, noted only when lying down. - Concern for weight gain. Reports previous weight gain on fluoxetine , which was discontinued as it worsened depressive symptoms. - Reports ongoing issues with depression.  Medications Past use of fluoxetine , which was stopped due to side effect of weight gain.  PMH, PSH, FH, Social Hx PMH: Depression. No history of seizures. Social Hx: History of loud noise exposure from firearms and occupational sources (heavy equipment, jackhammers).  ROS Ears: Reports hearing loss and tinnitus. Denies ear pain or discharge. No history of significant ear infections. Psychiatric: Reports history of depression and concern about weight gain as a side effect of medication.    ROS      Objective:    BP 101/68   Pulse 65   Ht 5' 7 (1.702 m)   Wt 188 lb 4 oz (85.4 kg)   SpO2 97%   BMI 29.48 kg/m    Physical Exam General: Alert, oriented HEENT: Normal TM bilaterally. CV: Heart rate rhythm Pulmonary: No respiratory stress   No results found for any visits on 05/26/24.      Assessment & Plan:   Bilateral hearing loss, unspecified hearing loss type Assessment & Plan: This is a longstanding issue of gradually worsening hearing loss and tinnitus, which is present only when supine. There is a history of significant noise exposure. Examination of the ears is unremarkable. - Refer to ENT on [address] per pt preference  for further evaluation. Patient advised they may require updated audiology testing prior to the appointment.   Orders: -     Ambulatory referral to ENT  Current moderate episode of major depressive disorder without prior episode [facility]) Assessment & Plan: History of depression, previously treated with fluoxetine  which was stopped due to weight gain that exacerbated depressive symptoms. Discussed bupropion  (Wellbutrin ) as an alternative antidepressant that is less likely to cause weight gain and may assist with weight loss. - Provided informational paperwork on bupropion /Wellbutrin . - If interested in starting the medication, will send a prescription and schedule a follow-up appointment.      Return for physical.  Toribio MARLA Slain, MD

## 2024-05-26 NOTE — Assessment & Plan Note (Signed)
 History of depression, previously treated with fluoxetine  which was stopped due to weight gain that exacerbated depressive symptoms. Discussed bupropion  (Wellbutrin ) as an alternative antidepressant that is less likely to cause weight gain and may assist with weight loss. - Provided informational paperwork on bupropion /Wellbutrin . - If interested in starting the medication, will send a prescription and schedule a follow-up appointment.

## 2024-05-26 NOTE — Assessment & Plan Note (Signed)
 This is a longstanding issue of gradually worsening hearing loss and tinnitus, which is present only when supine. There is a history of significant noise exposure. Examination of the ears is unremarkable. - Refer to ENT on [facility] per pt preference for further evaluation. Patient advised they may require updated audiology testing prior to the appointment.

## 2024-07-28 ENCOUNTER — Other Ambulatory Visit: Payer: Self-pay | Admitting: Family Medicine

## 2024-07-28 DIAGNOSIS — E786 Lipoprotein deficiency: Secondary | ICD-10-CM | POA: Insufficient documentation

## 2024-07-28 DIAGNOSIS — E663 Overweight: Secondary | ICD-10-CM | POA: Insufficient documentation

## 2024-07-30 ENCOUNTER — Other Ambulatory Visit

## 2024-07-30 DIAGNOSIS — E663 Overweight: Secondary | ICD-10-CM

## 2024-07-30 DIAGNOSIS — E786 Lipoprotein deficiency: Secondary | ICD-10-CM

## 2024-07-31 ENCOUNTER — Ambulatory Visit: Payer: Self-pay | Admitting: Family Medicine

## 2024-07-31 LAB — COMPREHENSIVE METABOLIC PANEL WITH GFR
ALT: 18 IU/L (ref 0–44)
AST: 15 IU/L (ref 0–40)
Albumin: 4.4 g/dL (ref 4.1–5.1)
Alkaline Phosphatase: 87 IU/L (ref 47–123)
BUN/Creatinine Ratio: 8 — ABNORMAL LOW (ref 9–20)
BUN: 8 mg/dL (ref 6–20)
Bilirubin Total: 0.5 mg/dL (ref 0.0–1.2)
CO2: 25 mmol/L (ref 20–29)
Calcium: 9.3 mg/dL (ref 8.7–10.2)
Chloride: 104 mmol/L (ref 96–106)
Creatinine, Ser: 1.05 mg/dL (ref 0.76–1.27)
Globulin, Total: 2.7 g/dL (ref 1.5–4.5)
Glucose: 85 mg/dL (ref 70–99)
Potassium: 3.9 mmol/L (ref 3.5–5.2)
Sodium: 144 mmol/L (ref 134–144)
Total Protein: 7.1 g/dL (ref 6.0–8.5)
eGFR: 94 mL/min/1.73 (ref >59)

## 2024-07-31 LAB — HEMOGLOBIN A1C
Est. average glucose Bld gHb Est-mCnc: 103 mg/dL
Hgb A1c MFr Bld: 5.2 % (ref 4.8–5.6)

## 2024-07-31 LAB — LIPID PANEL
Chol/HDL Ratio: 4.7 ratio (ref 0.0–5.0)
Cholesterol, Total: 156 mg/dL (ref 100–199)
HDL: 33 mg/dL — ABNORMAL LOW (ref >39)
LDL Chol Calc (NIH): 105 mg/dL — ABNORMAL HIGH (ref 0–99)
Triglycerides: 97 mg/dL (ref 0–149)
VLDL Cholesterol Cal: 18 mg/dL (ref 5–40)

## 2024-08-06 ENCOUNTER — Encounter: Payer: Self-pay | Admitting: Family Medicine

## 2024-08-06 ENCOUNTER — Ambulatory Visit (INDEPENDENT_AMBULATORY_CARE_PROVIDER_SITE_OTHER): Admitting: Family Medicine

## 2024-08-06 VITALS — BP 107/71 | HR 76 | Ht 67.0 in | Wt 185.8 lb

## 2024-08-06 DIAGNOSIS — H9193 Unspecified hearing loss, bilateral: Secondary | ICD-10-CM

## 2024-08-06 DIAGNOSIS — F321 Major depressive disorder, single episode, moderate: Secondary | ICD-10-CM

## 2024-08-06 DIAGNOSIS — E7800 Pure hypercholesterolemia, unspecified: Secondary | ICD-10-CM | POA: Diagnosis not present

## 2024-08-06 DIAGNOSIS — Z0000 Encounter for general adult medical examination without abnormal findings: Secondary | ICD-10-CM

## 2024-08-06 DIAGNOSIS — E663 Overweight: Secondary | ICD-10-CM

## 2024-08-06 NOTE — Assessment & Plan Note (Signed)
-   Reports ongoing irritability, noted by his wife. Has not started bupropion , which was previously discussed. Wife has questions about the medication. - Continue to monitor. - Patient/wife to message via MyChart with any questions regarding bupropion . May start if he chooses to after discussion.

## 2024-08-06 NOTE — Progress Notes (Unsigned)
 Annual physical  Subjective   Patient ID: Richard Chang, male    DOB: 08-06-1986  Age: 38 y.o. MRN: [id number]  Chief Complaint  Patient presents with   Annual Exam   HPI Richard Chang is a 38 y.o. old male here  for annual exam.   Subjective - Follow-up visit. Reports no new concerns since the last visit. - Reports continued hearing loss and tinnitus in both ears. Did not attend scheduled ENT appointment. - Reports recent weight loss, attributes this to physical activity from moving house. - Reports irritability, which has been noted by his wife. 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 Reports being unemployed to care for his wife who has multiple medical issues.  Medications Not currently on any prescribed medications. Was previously on fluoxetine  but has not taken it in a while. Information on bupropion  was provided.  PMH, PSH, FH, Social Hx PMHx: Hearing loss, tinnitus, depressive symptoms (irritability), borderline elevated LDL cholesterol. PSH: None mentioned. FH: No family history of colon cancer or prostate cancer at a young age. Social Hx: Resides in [place]. Recently moved house. Eats deer meat, sometimes mixed with pork to make sausage. 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 Declines flu vaccine due to past reactions of becoming ill post-vaccination. Tdap is not up to date. COVID vaccine also declined.  ROS HEENT: Positive for hearing loss and tinnitus. Psychiatric: Positive for irritability. Constitutional: Positive for weight loss.     The ASCVD Risk score (Arnett DK, et al., 2019) failed to calculate for the following reasons:   The 2019 ASCVD risk score is only valid for ages 41 to 35  Health Maintenance Due  Topic Date Due   HIV Screening  Never done   Hepatitis C Screening  Never done   HPV VACCINES (1 - 3-dose SCDM series) Never done   DTaP/Tdap/Td (7 - Td or Tdap) 05/07/2023   COVID-19 Vaccine (1 - 2025-26  season) Never done      Objective:     BP 107/71   Pulse 76   Ht 5' 7 (1.702 m)   Wt 185 lb 12 oz (84.3 kg)   SpO2 99%   BMI 29.09 kg/m  {Vitals History (Optional):23777}  Physical Exam Gen: alert, oriented HEENT: perrla, eomi, mmm CV: rrr, no murmur Pulm: lctab. No wheeze or crackles.  GI: soft, nbs.  Nontender to palpation MSK: strength equal b/l. Normal gait Ext: no pedal edema Skin: warm and dry, no rashes Psych: pleasant affect.  Spontaneous speech   No results found for any visits on 08/06/24.      Assessment & Plan:   Physical exam, annual  Bilateral hearing loss, unspecified hearing loss type Assessment & Plan: - Continues to experience hearing loss and tinnitus. Last audiogram was several years ago. Did not attend a previously scheduled ENT appointment. - Place order for an audiogram. - Recommended to complete the audiogram prior to rescheduling with ENT.  Orders: -     Ambulatory referral to Audiology  Current moderate episode of major depressive disorder without prior episode [facility]) Assessment & Plan: - Reports ongoing irritability, noted by his wife. Has not started bupropion , which was previously discussed. Wife has questions about the medication. - Continue to monitor. - Patient/wife to message via MyChart with any questions regarding bupropion . May start if he chooses to after discussion.   Healthcare maintenance Assessment & Plan: - Recent labs showed borderline elevated LDL cholesterol, normal A1C, and normal kidney and  liver function. Counseled on diet, specifically regarding saturated fats in red meat and pork. - Tetanus vaccine (Tdap) is not up-to-date. HPV and COVID vaccines also discussed and declined/not indicated. - All other health maintenance is up to date. - Continue diet and lifestyle modification for cholesterol. - Recommend updating Tdap vaccine. - Plan for yearly follow-up visits.      Return in about 1 year (around  08/06/2025) for physical.    Toribio MARLA Slain, MD

## 2024-08-06 NOTE — Assessment & Plan Note (Signed)
-   Continues to experience hearing loss and tinnitus. Last audiogram was several years ago. Did not attend a previously scheduled ENT appointment. - Place order for an audiogram. - Recommended to complete the audiogram prior to rescheduling with ENT.

## 2024-08-06 NOTE — Patient Instructions (Signed)
 It was nice to see you today,  We addressed the following topics today:  - I have placed an order for an audiogram. Please complete this test before you reschedule your appointment with the ENT specialist. - If you or your wife have any questions about bupropion  (Wellbutrin ), please message me through your MyChart account. - You can schedule your next yearly appointment at the front desk before you leave today.  Have a great day,  Rolan Slain, MD

## 2024-08-06 NOTE — Assessment & Plan Note (Signed)
-   Recent labs showed borderline elevated LDL cholesterol, normal A1C, and normal kidney and liver function. Counseled on diet, specifically regarding saturated fats in red meat and pork. - Tetanus vaccine (Tdap) is not up-to-date. HPV and COVID vaccines also discussed and declined/not indicated. - All other health maintenance is up to date. - Continue diet and lifestyle modification for cholesterol. - Recommend updating Tdap vaccine. - Plan for yearly follow-up visits.

## 2024-09-10 ENCOUNTER — Ambulatory Visit: Admitting: Audiologist

## 2024-09-25 ENCOUNTER — Ambulatory Visit: Admitting: Audiologist

## 2024-10-24 ENCOUNTER — Ambulatory Visit: Attending: Audiologist | Admitting: Audiologist
